# Patient Record
Sex: Male | Born: 1985
Health system: Southern US, Community
[De-identification: ages and names within clinical notes are randomized; demographics above are authoritative.]

## PROBLEM LIST (undated history)

## (undated) DIAGNOSIS — K219 Gastro-esophageal reflux disease without esophagitis: Secondary | ICD-10-CM

## (undated) DIAGNOSIS — M549 Dorsalgia, unspecified: Secondary | ICD-10-CM

---

## 2004-10-22 HISTORY — PX: CHOLECYSTECTOMY: SHX55

## 2010-09-06 ENCOUNTER — Ambulatory Visit: Payer: Self-pay | Admitting: Family Medicine

## 2010-09-06 DIAGNOSIS — J069 Acute upper respiratory infection, unspecified: Secondary | ICD-10-CM | POA: Insufficient documentation

## 2010-09-06 DIAGNOSIS — J209 Acute bronchitis, unspecified: Secondary | ICD-10-CM | POA: Insufficient documentation

## 2010-09-06 IMAGING — CR DG CHEST 2V
3 series · 3 of 3 positions shown · non-contrast
Comparison: None

CLINICAL DATA: Fever and cough

CHEST - 2 VIEW

[view not recorded (1 of 3)]
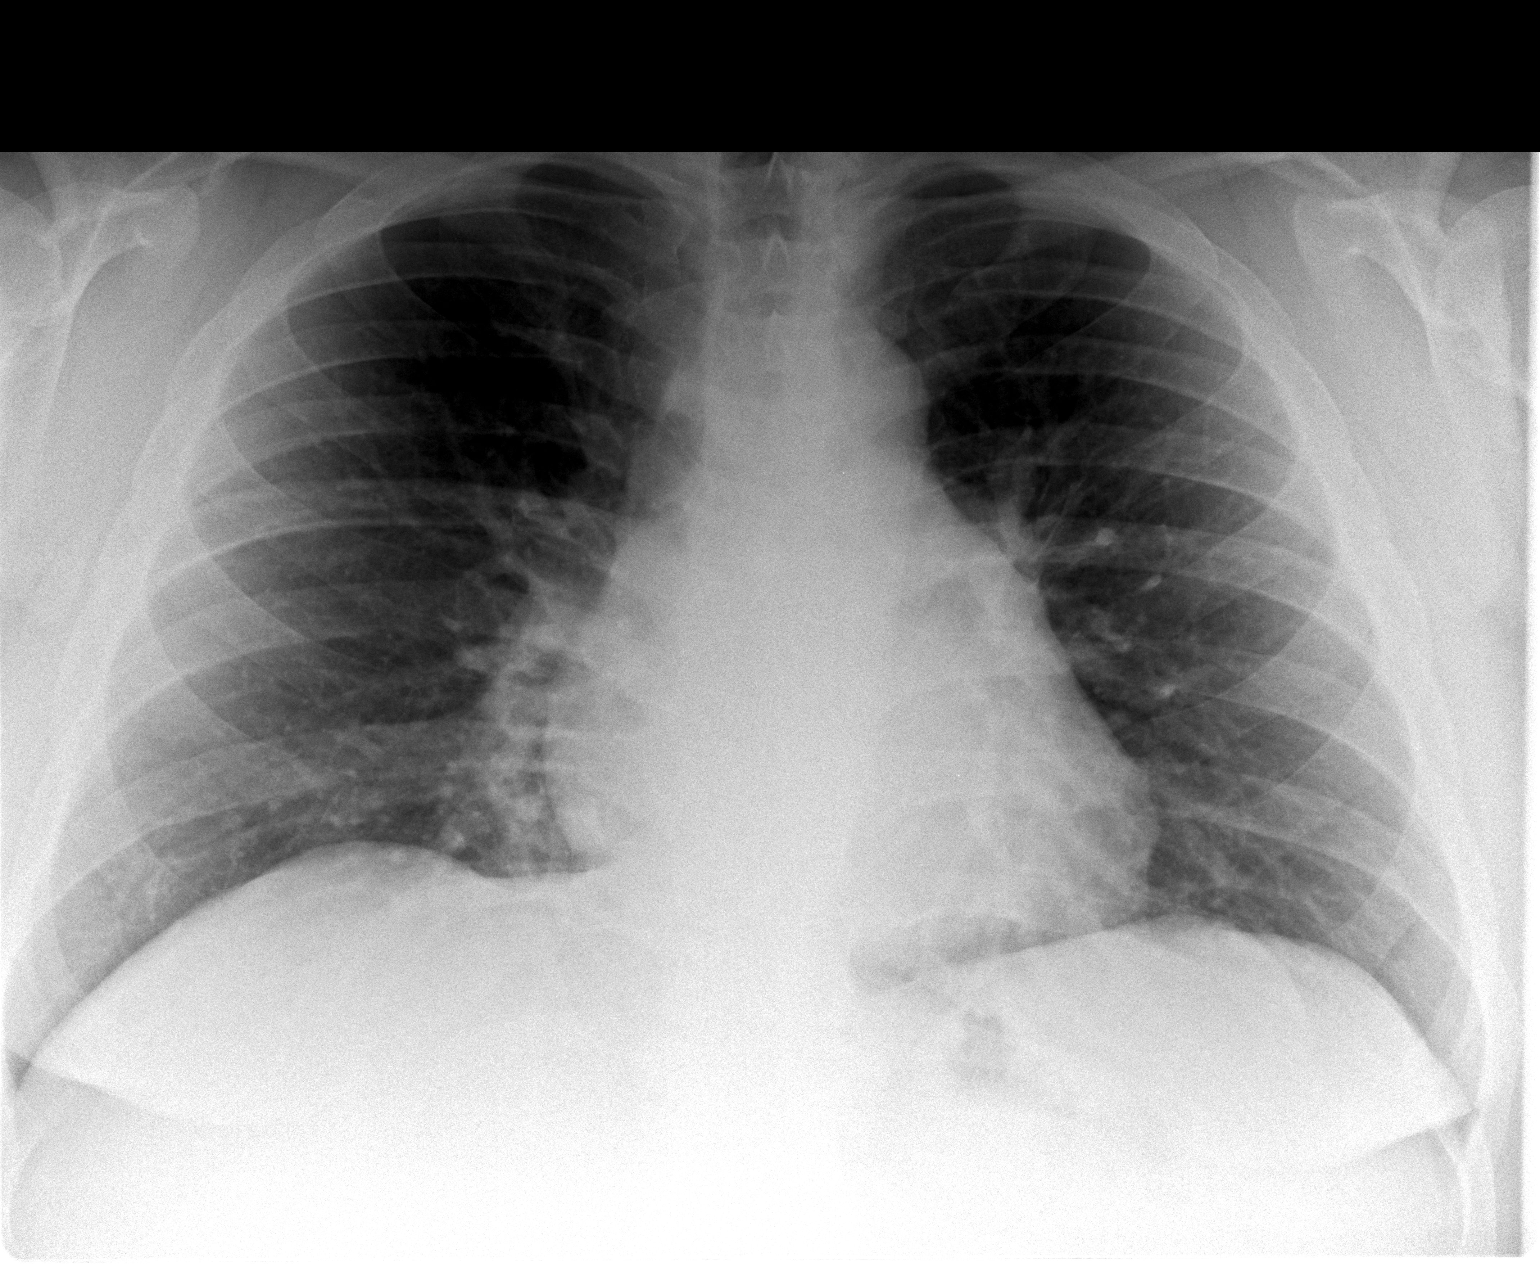

[view not recorded (2 of 3)]
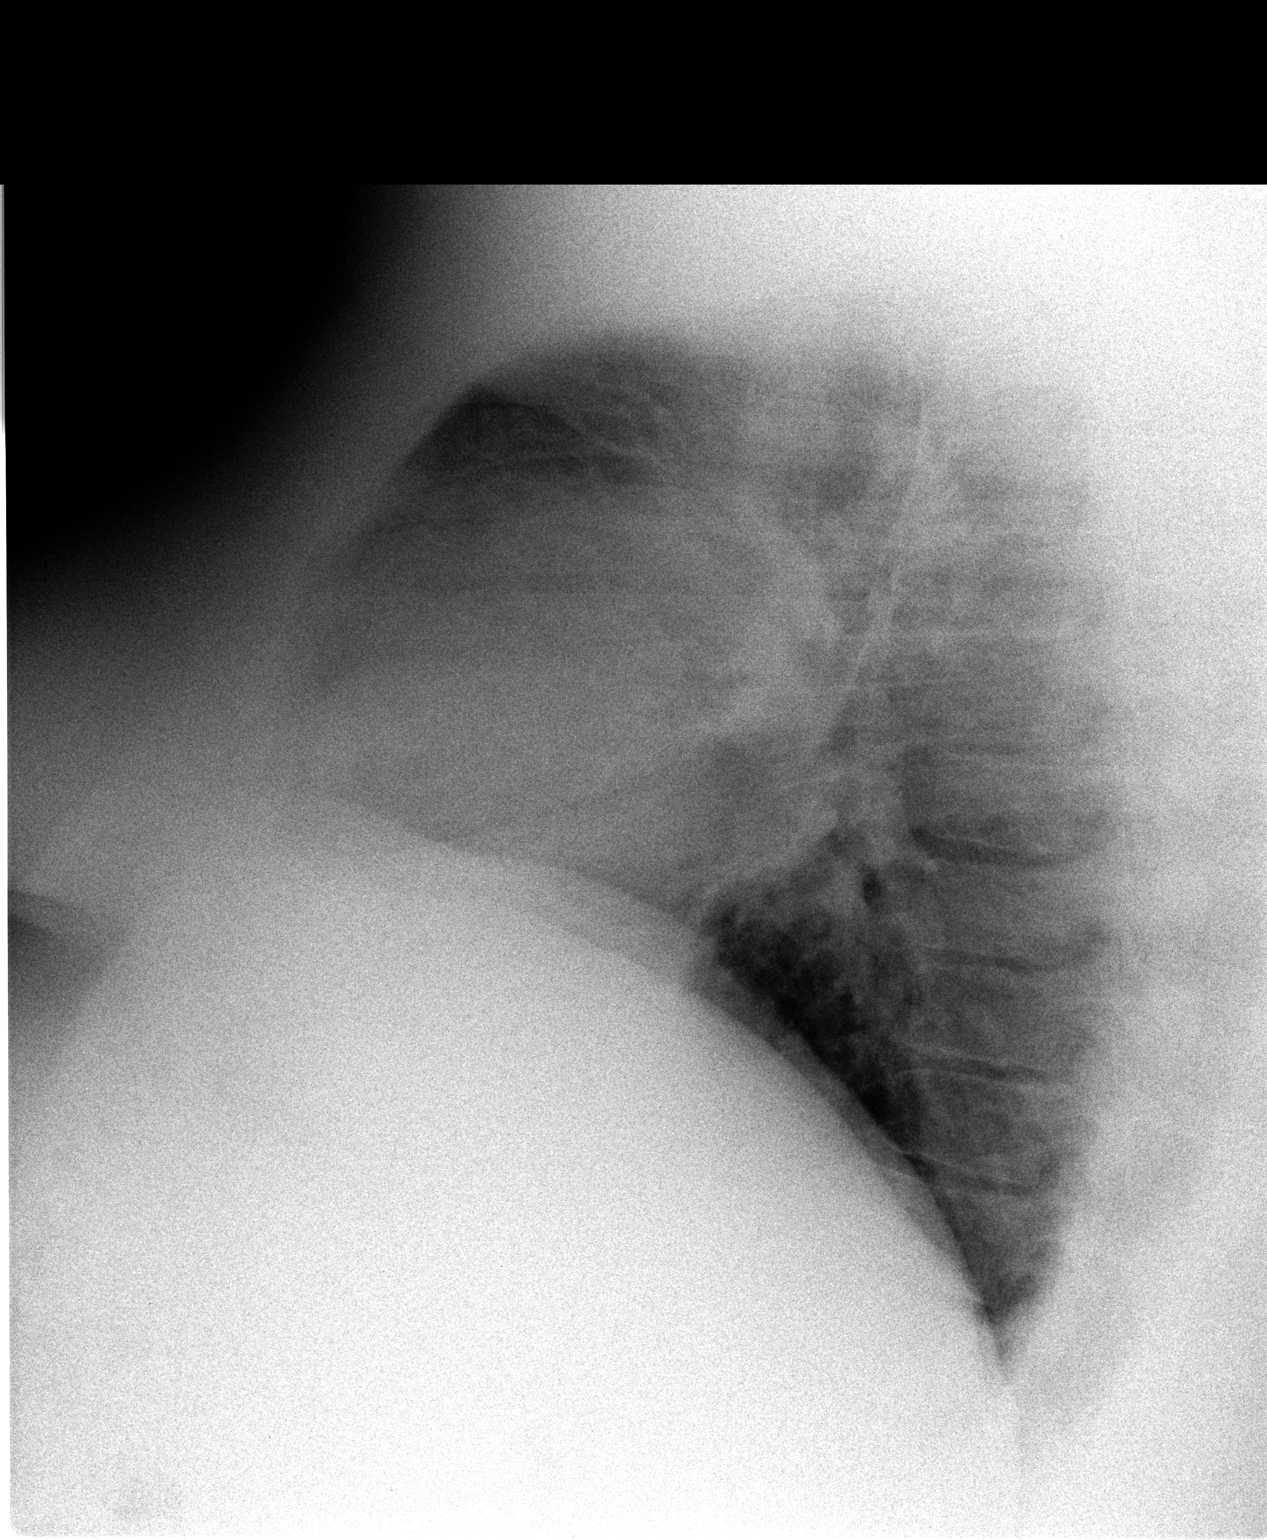

[view not recorded (3 of 3)]
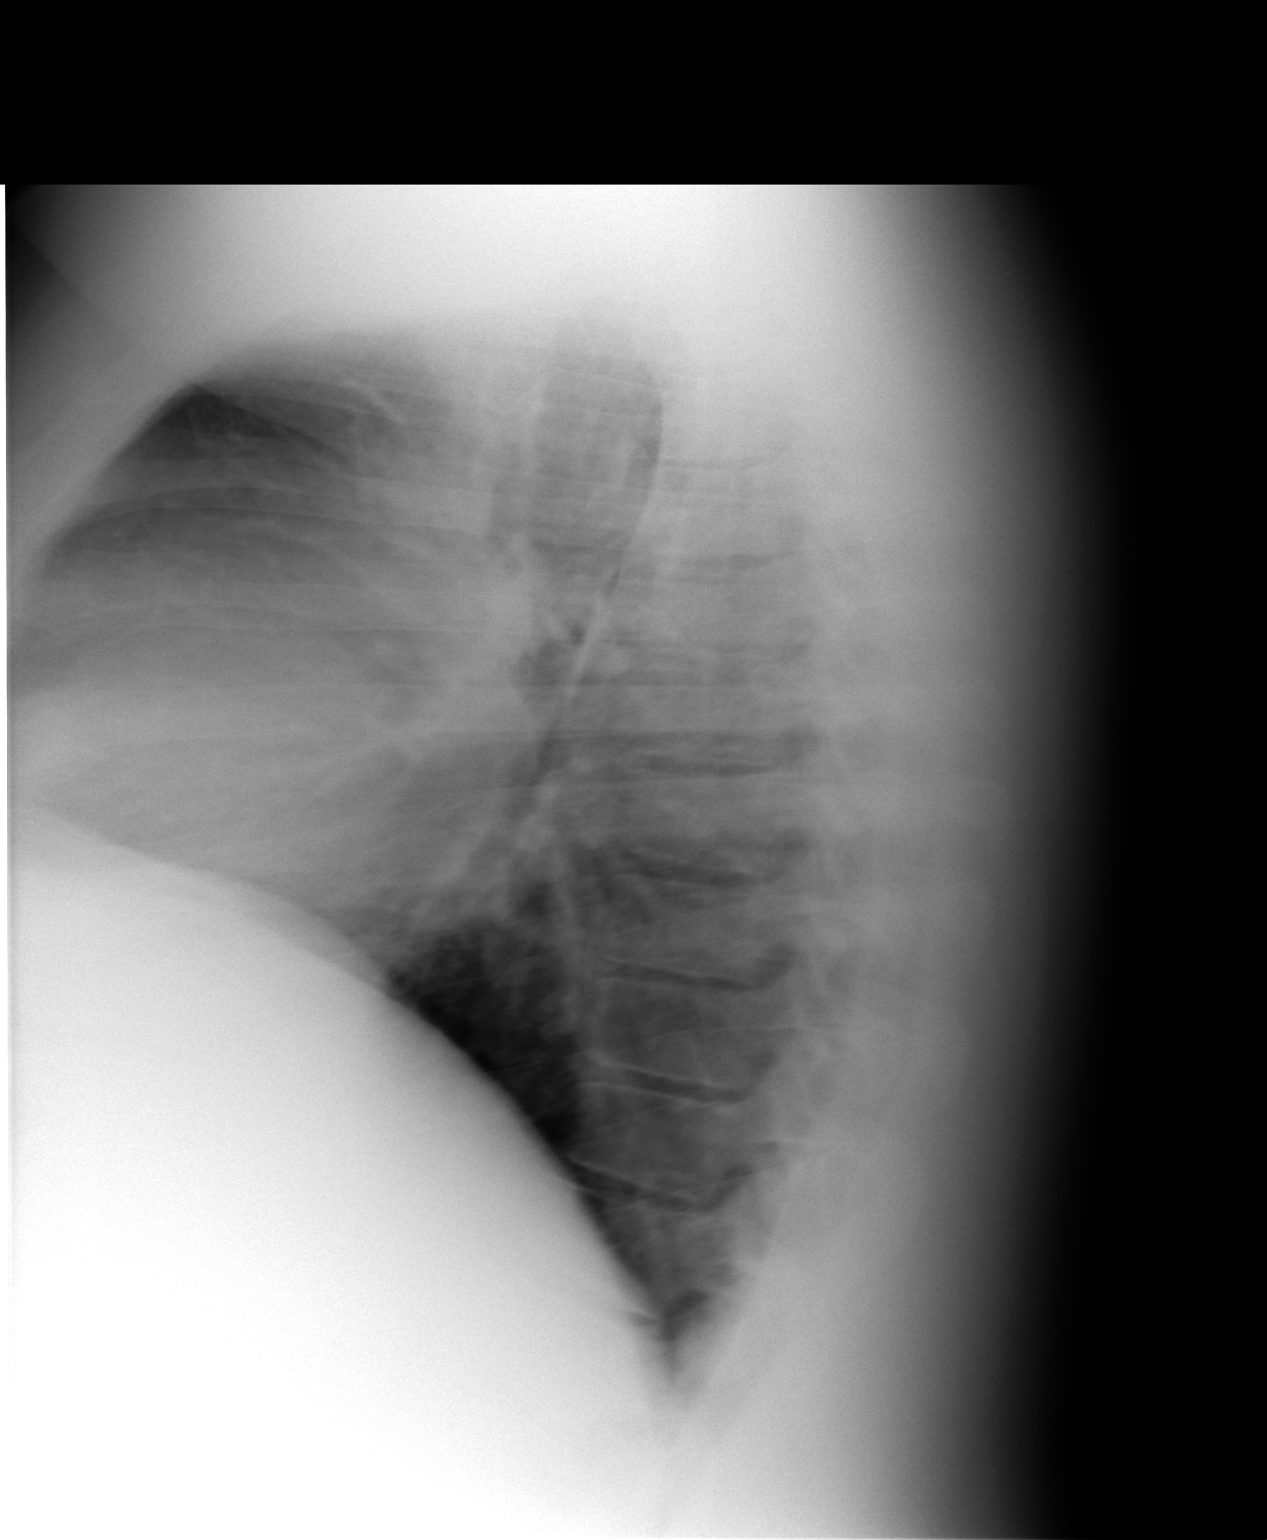

[3 of 3 positions shown; findings below may reference images not displayed]

FINDINGS: The cardiomediastinal silhouette is unremarkable.
Mild peribronchial thickening is identified.
There is no evidence of focal airspace disease, pulmonary edema,
pulmonary nodule/mass, pleural effusion, or pneumothorax.
No acute bony abnormalities are identified.
IMPRESSION: Mild peribronchial thickening without focal pneumonia.

## 2010-09-08 ENCOUNTER — Ambulatory Visit: Payer: Self-pay | Admitting: Emergency Medicine

## 2010-09-08 ENCOUNTER — Encounter: Payer: Self-pay | Admitting: Family Medicine

## 2010-11-21 NOTE — Letter (Signed)
Summary: Out of Work  [facility]  [address]   [place], [place] [address]   Phone: [phone]  Fax: [phone]    September 06, 2010   Employee:  Cloyce Lacuesta    To Whom It May Concern:   For Medical reasons, please excuse the above named employee from work today.  If you need additional information, please feel free to contact our office.         Sincerely,    Donna Christen MD

## 2010-11-21 NOTE — Assessment & Plan Note (Signed)
Summary: FEVER (rm 4)   Vital Signs:  Patient Profile:   25 Years Old Male CC:      fever, HA x today Height:     72 inches Weight:      355 pounds O2 Sat:      95 % O2 treatment:    Room Air Temp:     102.6 degrees F oral Pulse rate:   138 / minute Resp:     16 per minute BP sitting:   137 / 80  (left arm) Cuff size:   large  Pt. in pain?   yes    Location:   head    Intensity:   6    Type:       aching  Vitals Entered By: Lajean Saver RN (September 06, 2010 1:23 PM)                 `  Updated Prior Medication List: No Medications Current Allergies: ! PCN ! SULFA     History of Present Illness Chief Complaint: fever, HA x today History of Present Illness:  Subjective:  Patient suddenly developed cough, fever, chills, fatigue, headache, and myalgias today.  He has developed a vague discomfort in his right anterior chest with cough, but not pleuritic.  No sore throat or nasal congestion.  He has had pneumonia in the past.  No wheezing or shortness of breath.  No history of asthma. Not a smoker.  REVIEW OF SYSTEMS Constitutional Symptoms       Complains of fever and chills.     Denies night sweats, weight loss, weight gain, and fatigue.  Eyes       Denies change in vision, eye pain, eye discharge, glasses, contact lenses, and eye surgery. Ear/Nose/Throat/Mouth       Denies hearing loss/aids, change in hearing, ear pain, ear discharge, dizziness, frequent runny nose, frequent nose bleeds, sinus problems, sore throat, hoarseness, and tooth pain or bleeding.  Respiratory       Denies dry cough, productive cough, wheezing, shortness of breath, asthma, bronchitis, and emphysema/COPD.  Cardiovascular       Complains of chest pain.      Denies murmurs and tires easily with exhertion.      Comments: right sided with inhalation   Gastrointestinal       Complains of nausea/vomiting.      Denies stomach pain, diarrhea, constipation, blood in bowel movements, and  indigestion.      Comments: no vomiting Genitourniary       Denies painful urination, kidney stones, and loss of urinary control. Neurological       Complains of headaches.      Denies paralysis, seizures, and fainting/blackouts. Musculoskeletal       Denies muscle pain, joint pain, joint stiffness, decreased range of motion, redness, swelling, muscle weakness, and gout.  Skin       Denies bruising, unusual mles/lumps or sores, and hair/skin or nail changes.  Psych       Denies mood changes, temper/anger issues, anxiety/stress, speech problems, depression, and sleep problems. Other Comments: Patient c/o onset of fever, HA, and right sided CP when inhaling, the pain causes him to cough, x this AM.    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  Extraocular movement is intact.  Conjunctivae are not inflamed.  Ears:  Canals normal.  Tympanic membranes normal.   Nose:  No nasal congestion or sinus tenderness Pharynx:  Normal  Neck:  Supple.  No adenopathy is present.   Lungs:  Clear to auscultation.  Breath sounds are equal.  Heart:  Regular rate and rhythm without murmurs, rubs, or gallops.  Abdomen:  Nontender without masses or hepatosplenomegaly.  Bowel sounds are present.  No CVA or flank tenderness.  Extremities:  No edema.  CBC:  WBC 23.1 with 85.6 GR, 7.1 L    CHEST - 2 VIEW   Comparison: None   Findings: The cardiomediastinal silhouette is unremarkable. Mild peribronchial thickening is identified. There is no evidence of focal airspace disease, pulmonary edema, pulmonary  nodule/mass, pleural effusion, or pneumothorax. No acute bony abnormalities are identified.   IMPRESSION: Mild peribronchial thickening without focal pneumonia.   Assessment New Problems: ACUTE BRONCHITIS (ICD-466.0) RESPIRATORY DISORDER, ACUTE (ICD-465.9)   Plan New Medications/Changes: AZITHROMYCIN 500 MG TABS (AZITHROMYCIN) One by mouth once daily  #7 x 0, 09/06/2010, Donna Christen MD BENZONATATE 200 MG CAPS (BENZONATATE) One by mouth hs as needed cough  #12 x 0, 09/06/2010, Donna Christen MD CLARITHROMYCIN 500 MG TABS (CLARITHROMYCIN) One Tab by mouth two times a day  #20 x 0, 09/06/2010, Donna Christen MD  New Orders: T-Chest x-ray, 2 views [71020] CBC w/Diff [59563-87564] Rocephin  250mg  [J0696] Pulse Oximetry (single measurment) [94760] Admin of Therapeutic Inj  intramuscular or subcutaneous [96372] New Patient Level IV [33295] Planning Comments:   Rocephin 1gm  Rx written for Biaxin, but unavailable at pharmacy; will begin azithromycin 500mg  once daily for one week. Expectorant daytime, cough suppressant at bedtime.  Increase fluid intake. Return tomorrow for repeat WBC. If symptoms become significantly worse during the night,  proceed to the local emergency room.   The patient and/or caregiver has been counseled thoroughly with regard to medications prescribed including dosage, schedule, interactions, rationale for use, and possible side effects and they verbalize understanding.  Diagnoses and expected course of recovery discussed and will return if not improved as expected or if the condition worsens. Patient and/or caregiver verbalized understanding.  Prescriptions: AZITHROMYCIN 500 MG TABS (AZITHROMYCIN) One by mouth once daily  #7 x 0   Entered and Authorized by:   Donna Christen MD   Signed by:   Donna Christen MD on 09/06/2010   Method used:   Telephoned to ...         RxID:   [id number] BENZONATATE 200 MG CAPS (BENZONATATE) One by mouth hs as needed cough   #12 x 0   Entered and Authorized by:   Donna Christen MD   Signed by:   Donna Christen MD on 09/06/2010   Method used:   Print then Give to Patient   RxID:   [id number] CLARITHROMYCIN 500 MG TABS (CLARITHROMYCIN) One Tab by mouth two times a day  #20 x 0   Entered and Authorized by:   Donna Christen MD   Signed by:   Donna Christen MD on 09/06/2010   Method used:   Print then Give to Patient   RxID:   [id number]   Patient Instructions: 1)  May take Mucinex (guaifenesin) for cough. 2)  Increase fluid intake.  Rest.  Medication Administration  Injection # 1:  Medication: Rocephin  250mg     Diagnosis: ACUTE BRONCHITIS (ICD-466.0)    Route: IM    Site: LUOQ gluteus    Exp Date: 03/21/2013    Lot #: [id number]    Mfr: Novaplus    Comments: 1gm given    Patient tolerated injection without complications    Given by: Lajean Saver RN (September 06, 2010 2:57 PM)  Orders Added: 1)  T-Chest x-ray, 2 views [71020] 2)  CBC w/Diff [[id number]] 3)  Rocephin  250mg  [J0696] 4)  Pulse Oximetry (single measurment) [94760] 5)  Admin of Therapeutic Inj  intramuscular or subcutaneous [96372] 6)  New Patient Level IV [30865]

## 2011-07-24 ENCOUNTER — Encounter: Payer: Self-pay | Admitting: Family Medicine

## 2011-07-24 DIAGNOSIS — J40 Bronchitis, not specified as acute or chronic: Secondary | ICD-10-CM

## 2011-07-24 MED ORDER — AZITHROMYCIN 500 MG PO TABS: ORAL_TABLET | ORAL | Status: DC

## 2019-07-24 ENCOUNTER — Other Ambulatory Visit: Payer: Self-pay | Admitting: Occupational Medicine

## 2019-07-25 LAB — CBC WITH DIFFERENTIAL/PLATELET
Absolute Monocytes: 692 cells/uL (ref 200–950)
Basophils Absolute: 38 cells/uL (ref 0–200)
Basophils Relative: 0.5 %
Eosinophils Absolute: 91 cells/uL (ref 15–500)
Eosinophils Relative: 1.2 %
HCT: 47.2 % (ref 38.5–50.0)
Hemoglobin: 16.1 g/dL (ref 13.2–17.1)
Lymphs Abs: 2075 cells/uL (ref 850–3900)
MCH: 27.2 pg (ref 27.0–33.0)
MCHC: 34.1 g/dL (ref 32.0–36.0)
MCV: 79.9 fL — ABNORMAL LOW (ref 80.0–100.0)
MPV: 10.1 fL (ref 7.5–12.5)
Monocytes Relative: 9.1 %
Neutro Abs: 4704 cells/uL (ref 1500–7800)
Neutrophils Relative %: 61.9 %
Platelets: 285 10*3/uL (ref 140–400)
RBC: 5.91 10*6/uL — ABNORMAL HIGH (ref 4.20–5.80)
RDW: 13.7 % (ref 11.0–15.0)
Total Lymphocyte: 27.3 %
WBC: 7.6 10*3/uL (ref 3.8–10.8)

## 2019-07-25 LAB — COMPLETE METABOLIC PANEL WITH GFR
AG Ratio: 1.3 (calc) (ref 1.0–2.5)
ALT: 51 U/L — ABNORMAL HIGH (ref 9–46)
AST: 31 U/L (ref 10–40)
Albumin: 4.4 g/dL (ref 3.6–5.1)
Alkaline phosphatase (APISO): 44 U/L (ref 36–130)
BUN: 17 mg/dL (ref 7–25)
CO2: 28 mmol/L (ref 20–32)
Calcium: 10 mg/dL (ref 8.6–10.3)
Chloride: 101 mmol/L (ref 98–110)
Creat: 0.96 mg/dL (ref 0.60–1.35)
GFR, Est African American: 118 mL/min/{1.73_m2} (ref >=60)
GFR, Est Non African American: 102 mL/min/{1.73_m2} (ref >=60)
Globulin: 3.3 g/dL (calc) (ref 1.9–3.7)
Glucose, Bld: 93 mg/dL (ref 65–99)
Potassium: 4.2 mmol/L (ref 3.5–5.3)
Sodium: 138 mmol/L (ref 135–146)
Total Bilirubin: 0.6 mg/dL (ref 0.2–1.2)
Total Protein: 7.7 g/dL (ref 6.1–8.1)

## 2019-07-25 LAB — LIPID PANEL
Cholesterol: 162 mg/dL (ref <200)
HDL: 30 mg/dL — ABNORMAL LOW (ref >=40)
LDL Cholesterol (Calc): 100 mg/dL (calc) — ABNORMAL HIGH
Non-HDL Cholesterol (Calc): 132 mg/dL (calc) — ABNORMAL HIGH (ref <130)
Total CHOL/HDL Ratio: 5.4 (calc) — ABNORMAL HIGH (ref <5.0)
Triglycerides: 208 mg/dL — ABNORMAL HIGH (ref <150)

## 2019-07-25 LAB — PSA: PSA: 0.4 ng/mL (ref <=4.0)

## 2020-01-25 DIAGNOSIS — M79661 Pain in right lower leg: Secondary | ICD-10-CM | POA: Diagnosis not present

## 2020-01-25 DIAGNOSIS — M79604 Pain in right leg: Secondary | ICD-10-CM | POA: Diagnosis not present

## 2020-07-28 ENCOUNTER — Telehealth: Payer: Self-pay | Admitting: Sports Medicine

## 2020-07-28 DIAGNOSIS — R635 Abnormal weight gain: Secondary | ICD-10-CM | POA: Insufficient documentation

## 2020-07-28 MED ORDER — TADALAFIL 5 MG PO TABS: 5.0000 mg | ORAL_TABLET | Freq: Every day | ORAL | 3 refills | Status: DC | PRN

## 2020-07-28 MED ORDER — WEGOVY 1 MG/0.5ML ~~LOC~~ SOAJ: 1.0000 mg | SUBCUTANEOUS | 0 refills | Status: DC

## 2020-07-28 MED ORDER — WEGOVY 0.25 MG/0.5ML ~~LOC~~ SOAJ: 0.2500 mg | SUBCUTANEOUS | 0 refills | Status: DC

## 2020-07-28 MED ORDER — WEGOVY 0.5 MG/0.5ML ~~LOC~~ SOAJ: 0.5000 mg | SUBCUTANEOUS | 0 refills | Status: DC

## 2020-07-28 NOTE — Telephone Encounter (Signed)
Patient is requesting Wegovy and tadalafil.  These will be sent in, he was given a discount coupon for [facility].

## 2020-07-28 NOTE — Assessment & Plan Note (Signed)
Starting [facility], patient has the coupon, we will do the first 3 doses.

## 2020-08-24 ENCOUNTER — Other Ambulatory Visit: Payer: Self-pay | Admitting: Sports Medicine

## 2020-08-24 DIAGNOSIS — R635 Abnormal weight gain: Secondary | ICD-10-CM

## 2020-08-26 ENCOUNTER — Other Ambulatory Visit: Payer: Self-pay | Admitting: Sports Medicine

## 2020-08-26 ENCOUNTER — Telehealth: Payer: Self-pay | Admitting: Sports Medicine

## 2020-08-26 DIAGNOSIS — F341 Dysthymic disorder: Secondary | ICD-10-CM

## 2020-08-26 DIAGNOSIS — F32A Depression, unspecified: Secondary | ICD-10-CM | POA: Insufficient documentation

## 2020-08-26 DIAGNOSIS — R635 Abnormal weight gain: Secondary | ICD-10-CM | POA: Diagnosis not present

## 2020-08-26 DIAGNOSIS — Z23 Encounter for immunization: Secondary | ICD-10-CM | POA: Diagnosis not present

## 2020-08-26 MED ORDER — BUPROPION HCL ER (XL) 150 MG PO TB24: 150.0000 mg | ORAL_TABLET | ORAL | 11 refills | Status: DC

## 2020-08-26 MED FILL — buPROPion HCL ER (XL) 150 M: 150 | 30 days supply | Qty: 30 | Fill #0

## 2020-08-26 NOTE — Telephone Encounter (Signed)
Wellbutrin per patient request

## 2020-08-26 NOTE — Assessment & Plan Note (Signed)
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 No suicidal or homicidal ideation, he does plan to get me his PHQ-9 and GAD-7 scores for tracking purposes. I would like to revisit this in 6 weeks.

## 2020-08-27 LAB — COMPREHENSIVE METABOLIC PANEL
AG Ratio: 1.3 (calc) (ref 1.0–2.5)
ALT: 34 U/L (ref 9–46)
AST: 21 U/L (ref 10–40)
Albumin: 4.4 g/dL (ref 3.6–5.1)
Alkaline phosphatase (APISO): 49 U/L (ref 36–130)
BUN: 14 mg/dL (ref 7–25)
CO2: 28 mmol/L (ref 20–32)
Calcium: 9.8 mg/dL (ref 8.6–10.3)
Chloride: 102 mmol/L (ref 98–110)
Creat: 0.93 mg/dL (ref 0.60–1.35)
Globulin: 3.4 g/dL (calc) (ref 1.9–3.7)
Glucose, Bld: 85 mg/dL (ref 65–99)
Potassium: 4.5 mmol/L (ref 3.5–5.3)
Sodium: 139 mmol/L (ref 135–146)
Total Bilirubin: 0.6 mg/dL (ref 0.2–1.2)
Total Protein: 7.8 g/dL (ref 6.1–8.1)

## 2020-08-27 LAB — CBC
HCT: 48.5 % (ref 38.5–50.0)
Hemoglobin: 16.1 g/dL (ref 13.2–17.1)
MCH: 26.5 pg — ABNORMAL LOW (ref 27.0–33.0)
MCHC: 33.2 g/dL (ref 32.0–36.0)
MCV: 79.8 fL — ABNORMAL LOW (ref 80.0–100.0)
MPV: 9.6 fL (ref 7.5–12.5)
Platelets: 309 10*3/uL (ref 140–400)
RBC: 6.08 10*6/uL — ABNORMAL HIGH (ref 4.20–5.80)
RDW: 13.1 % (ref 11.0–15.0)
WBC: 8.1 10*3/uL (ref 3.8–10.8)

## 2020-08-27 LAB — LIPID PANEL
Cholesterol: 161 mg/dL (ref <200)
HDL: 29 mg/dL — ABNORMAL LOW (ref >=40)
LDL Cholesterol (Calc): 103 mg/dL (calc) — ABNORMAL HIGH
Non-HDL Cholesterol (Calc): 132 mg/dL (calc) — ABNORMAL HIGH (ref <130)
Total CHOL/HDL Ratio: 5.6 (calc) — ABNORMAL HIGH (ref <5.0)
Triglycerides: 175 mg/dL — ABNORMAL HIGH (ref <150)

## 2020-08-27 LAB — HEMOGLOBIN A1C
Hgb A1c MFr Bld: 5.3 % of total Hgb (ref <5.7)
Mean Plasma Glucose: 105 (calc)
eAG (mmol/L): 5.8 (calc)

## 2020-08-27 LAB — TSH: TSH: 1.95 mIU/L (ref 0.40–4.50)

## 2020-08-29 ENCOUNTER — Encounter: Payer: Self-pay | Admitting: Sports Medicine

## 2020-09-02 ENCOUNTER — Ambulatory Visit (INDEPENDENT_AMBULATORY_CARE_PROVIDER_SITE_OTHER): Payer: 59 | Admitting: Sports Medicine

## 2020-09-02 ENCOUNTER — Other Ambulatory Visit: Payer: Self-pay

## 2020-09-02 ENCOUNTER — Encounter: Payer: Self-pay | Admitting: Sports Medicine

## 2020-09-02 VITALS — BP 134/83 | HR 82 | Wt 373.0 lb

## 2020-09-02 DIAGNOSIS — Z0000 Encounter for general adult medical examination without abnormal findings: Secondary | ICD-10-CM | POA: Insufficient documentation

## 2020-09-02 DIAGNOSIS — R635 Abnormal weight gain: Secondary | ICD-10-CM | POA: Diagnosis not present

## 2020-09-02 DIAGNOSIS — L918 Other hypertrophic disorders of the skin: Secondary | ICD-10-CM

## 2020-09-02 NOTE — Assessment & Plan Note (Signed)
Cryotherapy of countless skin tags on the neck and axillae.

## 2020-09-02 NOTE — Assessment & Plan Note (Signed)
Annual physical, we froze some skin tags as well on his neck and axillae.

## 2020-09-02 NOTE — Assessment & Plan Note (Signed)
Good weight loss after the first month to 5 weeks of treatment.

## 2020-09-02 NOTE — Progress Notes (Signed)
Subjective:    CC: Annual Physical Exam  HPI:  This patient is here for their annual physical  I reviewed the past medical history, family history, social history, surgical history, and allergies today and no changes were needed.  Please see the problem list section below in epic for further details.  Past Medical History: History reviewed. No pertinent past medical history. Past Surgical History: History reviewed. No pertinent surgical history. Social History: Social History   Socioeconomic History  . Marital status: Married    Spouse name: Not on file  . Number of children: Not on file  . Years of education: Not on file  . Highest education level: Not on file  Occupational History  . Not on file  Tobacco Use  . Smoking status: Not on file  Substance and Sexual Activity  . Alcohol use: Not on file  . Drug use: Not on file  . Sexual activity: Not on file  Other Topics Concern  . Not on file  Social History Narrative  . Not on file   Social Determinants of Health   Financial Resource Strain:   . Difficulty of Paying Living Expenses: Not on file  Food Insecurity:   . Worried About Programme researcher, broadcasting/film/video in the Last Year: Not on file  . Ran Out of Food in the Last Year: Not on file  Transportation Needs:   . Lack of Transportation (Medical): Not on file  . Lack of Transportation (Non-Medical): Not on file  Physical Activity:   . Days of Exercise per Week: Not on file  . Minutes of Exercise per Session: Not on file  Stress:   . Feeling of Stress : Not on file  Social Connections:   . Frequency of Communication with Friends and Family: Not on file  . Frequency of Social Gatherings with Friends and Family: Not on file  . Attends Religious Services: Not on file  . Active Member of Clubs or Organizations: Not on file  . Attends Banker Meetings: Not on file  . Marital Status: Not on file   Family History: No family history on file. Allergies: Allergies   Allergen Reactions  . Penicillins   . Sulfonamide Derivatives    Medications: See med rec.  Review of Systems: No headache, visual changes, nausea, vomiting, diarrhea, constipation, dizziness, abdominal pain, skin rash, fevers, chills, night sweats, swollen lymph nodes, weight loss, chest pain, body aches, joint swelling, muscle aches, shortness of breath, mood changes, visual or auditory hallucinations.  Objective:    General: Well Developed, well nourished, and in no acute distress.  Neuro: Alert and oriented x3, extra-ocular muscles intact, sensation grossly intact. Cranial nerves II through XII are intact, motor, sensory, and coordinative functions are all intact. HEENT: Normocephalic, atraumatic, pupils equal round reactive to light, neck supple, no masses, no lymphadenopathy, thyroid nonpalpable. Oropharynx, nasopharynx, external ear canals are unremarkable. Skin: Warm and dry, no rashes noted.  Numerous skin tags Cardiac: Regular rate and rhythm, no murmurs rubs or gallops.  Respiratory: Clear to auscultation bilaterally. Not using accessory muscles, speaking in full sentences.  Abdominal: Soft, nontender, nondistended, positive bowel sounds, no masses, no organomegaly.  Musculoskeletal: Shoulder, elbow, wrist, hip, knee, ankle stable, and with full range of motion.  Procedure:  Cryodestruction of numerous skin tags on the neck, axillary Consent obtained and verified. Time-out conducted. Noted no overlying erythema, induration, or other signs of local infection. Completed without difficulty using Cryo-Gun. Advised to call if fevers/chills, erythema, induration, drainage,  or persistent bleeding.  Impression and Recommendations:    The patient was counselled, risk factors were discussed, anticipatory guidance given.  Annual physical exam Annual physical, we froze some skin tags as well on his neck and axillae.   Skin tag Cryotherapy of countless skin tags on the neck and  axillae.  Abnormal weight gain Good weight loss after the first month to 5 weeks of treatment.   ___________________________________________ Ihor Austin. Benjamin Stain, M.D., ABFM., CAQSM. Primary Care and Sports Medicine [place] [facility]  Adjunct Professor of Family Medicine  [employer]

## 2020-09-21 MED FILL — buPROPion HCL ER (XL) 150 M: 150 | 30 days supply | Qty: 30 | Fill #1

## 2020-09-23 ENCOUNTER — Ambulatory Visit: Payer: 59 | Admitting: Sports Medicine

## 2020-10-21 MED FILL — buPROPion HCL ER (XL) 150 M: 150 | 30 days supply | Qty: 30 | Fill #2

## 2020-12-19 MED FILL — buPROPion HCL ER (XL) 150 M: 150 | 30 days supply | Qty: 30 | Fill #4

## 2021-01-12 ENCOUNTER — Other Ambulatory Visit (HOSPITAL_BASED_OUTPATIENT_CLINIC_OR_DEPARTMENT_OTHER): Payer: Self-pay

## 2021-01-18 ENCOUNTER — Other Ambulatory Visit: Payer: Self-pay | Admitting: Sports Medicine

## 2021-01-18 MED ORDER — PREDNISONE 50 MG PO TABS: ORAL_TABLET | ORAL | 0 refills | Status: DC

## 2021-01-20 ENCOUNTER — Ambulatory Visit (INDEPENDENT_AMBULATORY_CARE_PROVIDER_SITE_OTHER): Payer: 59 | Admitting: Sports Medicine

## 2021-01-20 ENCOUNTER — Other Ambulatory Visit: Payer: Self-pay

## 2021-01-20 ENCOUNTER — Ambulatory Visit (INDEPENDENT_AMBULATORY_CARE_PROVIDER_SITE_OTHER): Payer: 59

## 2021-01-20 DIAGNOSIS — M5135 Other intervertebral disc degeneration, thoracolumbar region: Secondary | ICD-10-CM | POA: Insufficient documentation

## 2021-01-20 DIAGNOSIS — M5136 Other intervertebral disc degeneration, lumbar region: Secondary | ICD-10-CM

## 2021-01-20 DIAGNOSIS — F32A Depression, unspecified: Secondary | ICD-10-CM | POA: Diagnosis not present

## 2021-01-20 DIAGNOSIS — F419 Anxiety disorder, unspecified: Secondary | ICD-10-CM

## 2021-01-20 DIAGNOSIS — M51369 Other intervertebral disc degeneration, lumbar region without mention of lumbar back pain or lower extremity pain: Secondary | ICD-10-CM

## 2021-01-20 DIAGNOSIS — M545 Low back pain, unspecified: Secondary | ICD-10-CM | POA: Diagnosis not present

## 2021-01-20 IMAGING — DX DG LUMBAR SPINE COMPLETE 4+V
5 series · 5 of 5 positions shown · non-contrast
Comparison: None.

CLINICAL DATA: Pain

EXAM:
LUMBAR SPINE - COMPLETE 4+ VIEW

[l-spine ap]
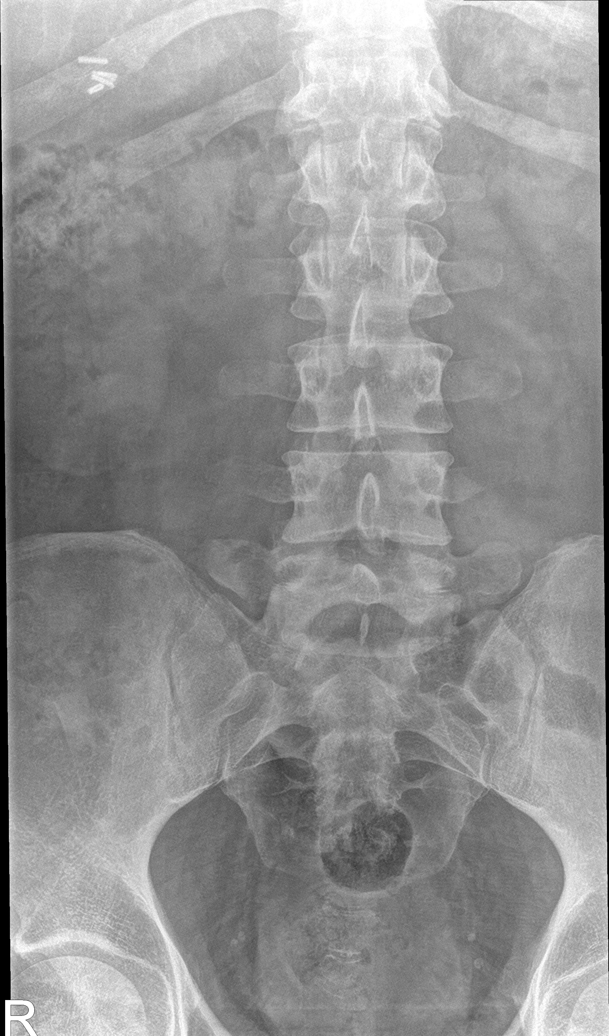

[l-spine obl (1 of 2)]
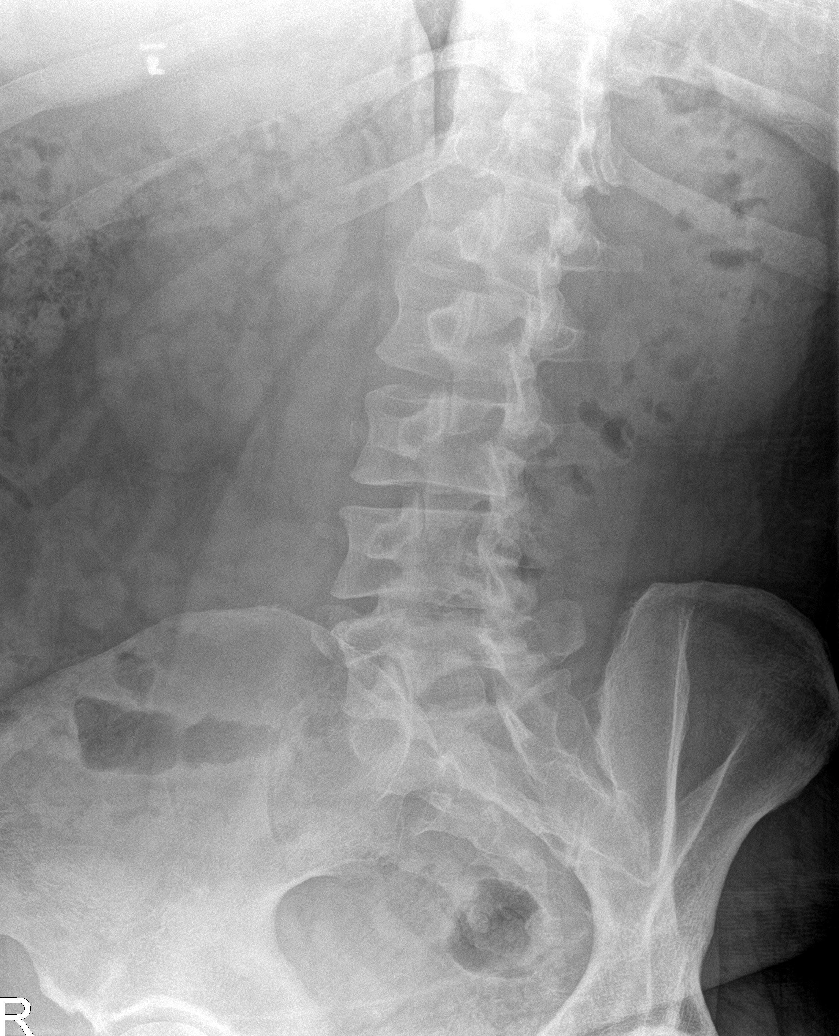

[l-spine obl (2 of 2)]
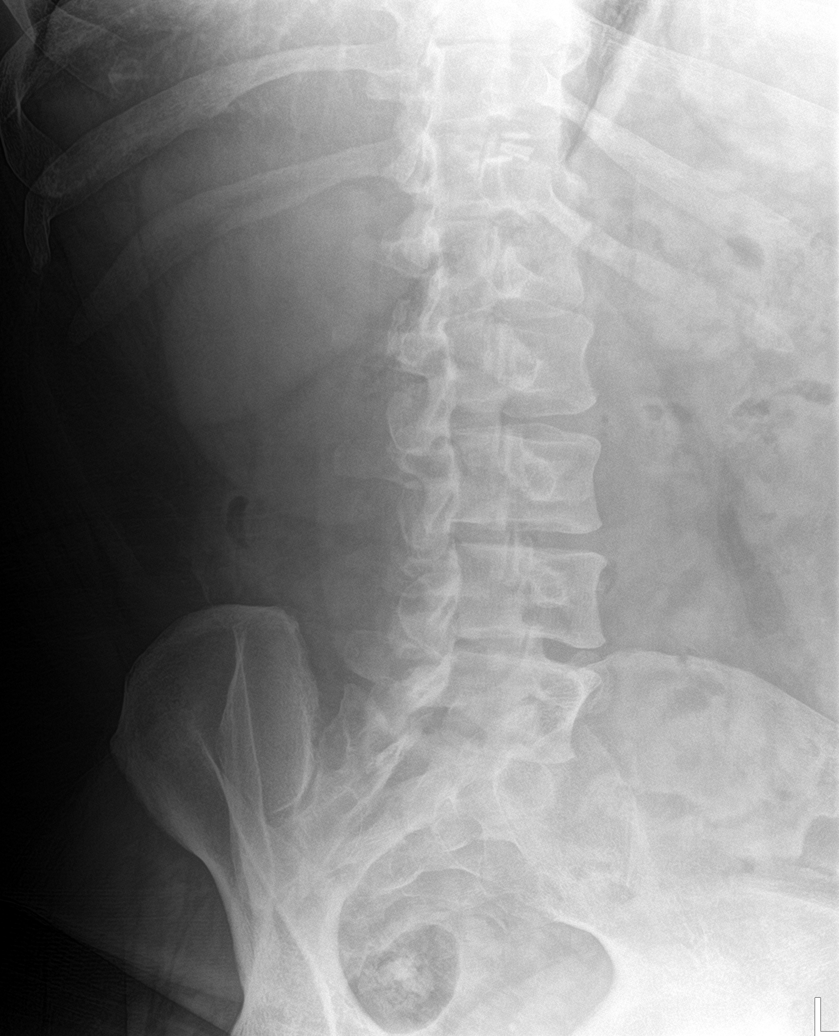

[l-spine lat]
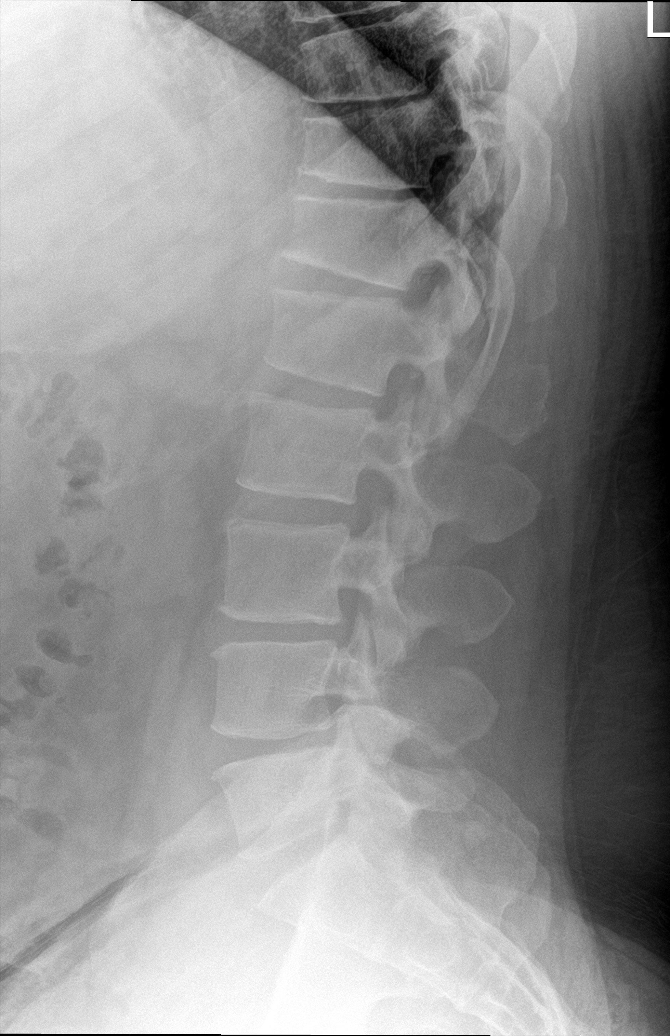

[l-spine spot]
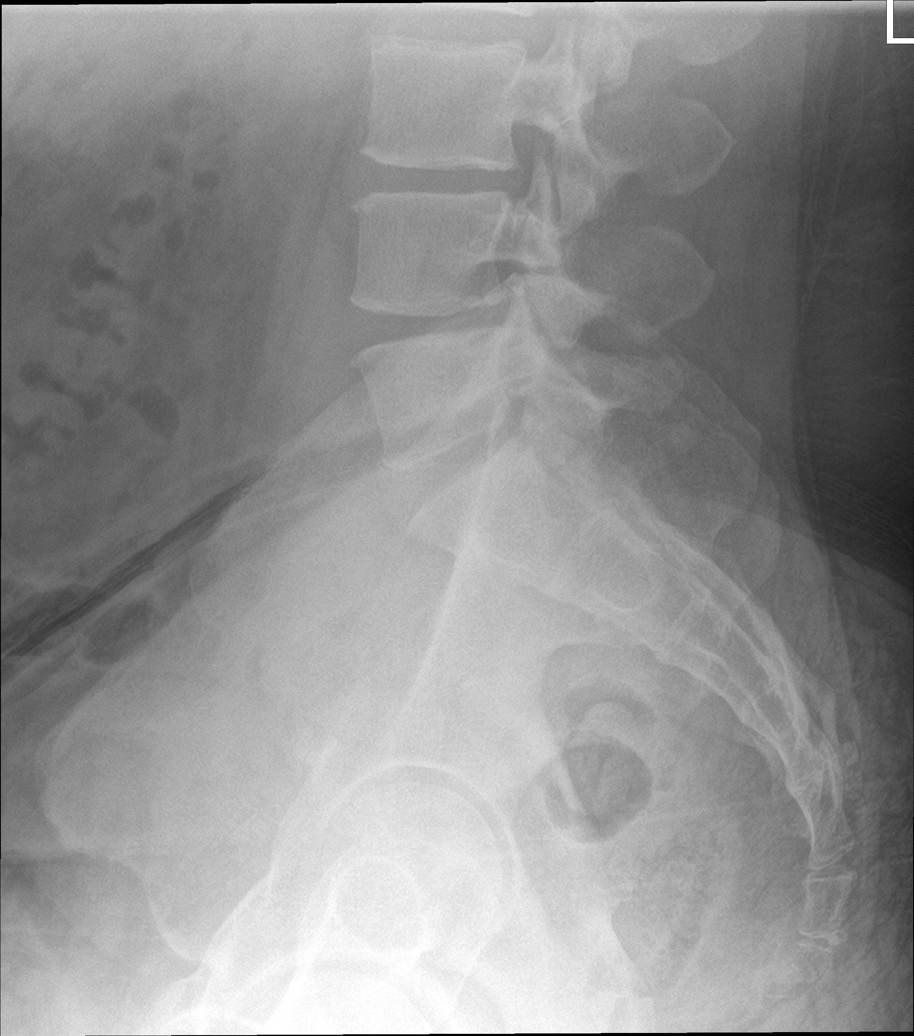

[5 of 5 positions shown; findings below may reference images not displayed]

FINDINGS: There is anterior wedging of the T12 and T11 vertebral bodies which
is age indeterminate. There is no definite acute compression
fracture of the lumbar spine. There is mild disc height loss at the
L3-L4, L4-L5, and L5-S1 levels. There is disc height loss at the
T10-T11 level. There are surgical clips in the right upper quadrant
which are presumably secondary to prior cholecystectomy. There are
small calcifications projecting over the patient's pelvis, favored
to represent phleboliths.
IMPRESSION: 1. Age-indeterminate anterior wedging of the T11 and T12 vertebral
bodies. Correlation with point tenderness is recommended.
2. No definite acute compression fracture of the lumbar spine.
3. Mild disc height loss as detailed above.

## 2021-01-20 MED ORDER — ESCITALOPRAM OXALATE 10 MG PO TABS: 10.0000 mg | ORAL_TABLET | Freq: Every day | ORAL | 3 refills | Status: DC

## 2021-01-20 MED ORDER — GABAPENTIN 300 MG PO CAPS: ORAL_CAPSULE | ORAL | 11 refills | Status: DC

## 2021-01-20 NOTE — Assessment & Plan Note (Signed)
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 Worse with sitting, flexion, Valsalva, no bowel or bladder dysfunction, saddle numbness, constitutional symptoms, no trauma. We just started prednisone, he is taking some ibuprofen with it, supplemented with omeprazole and not getting any dyspepsia. I am going to add x-rays, formal physical therapy, gabapentin to use at night. We can touch base again with regards to this in about 6 weeks and proceed with MRI for interventional planning if not better. He does understand the treatment protocol very well, and knows the steps ahead.

## 2021-01-20 NOTE — Assessment & Plan Note (Addendum)
Touch of dysthymia, seasonal affective disorder, doing much better with Wellbutrin. Adding a bit of Lexapro that he can titrate himself, switch to Trintellix if anorgasmia. Touch base with me for this again in about 6 weeks.

## 2021-01-20 NOTE — Progress Notes (Signed)
    Procedures performed today:    None.  Independent interpretation of notes and tests performed by another provider:   None.  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 Worse with sitting, flexion, Valsalva, no bowel or bladder dysfunction, saddle numbness, constitutional symptoms, no trauma. We just started prednisone, he is taking some ibuprofen with it, supplemented with omeprazole and not getting any dyspepsia. I am going to add x-rays, formal physical therapy, gabapentin to use at night. We can touch base again with regards to this in about 6 weeks and proceed with MRI for interventional planning if not better. He does understand the treatment protocol very well, and knows the steps ahead.  Anxiety and depression Touch of dysthymia, seasonal affective disorder, doing much better with Wellbutrin. Adding a bit of Lexapro that he can titrate himself, switch to Trintellix if anorgasmia. Touch base with me for this again in about 6 weeks.    ___________________________________________ Ihor Austin. Benjamin Stain, M.D., ABFM., CAQSM. Primary Care and Sports Medicine [place] [facility]  Adjunct Instructor of Family Medicine  [employer]

## 2021-01-23 MED FILL — Bupropion HCl Tab ER 24HR 150 MG: ORAL | 30 days supply | Qty: 30 | Fill #0 | Status: AC

## 2021-01-24 ENCOUNTER — Other Ambulatory Visit (HOSPITAL_COMMUNITY): Payer: Self-pay

## 2021-01-25 ENCOUNTER — Other Ambulatory Visit (HOSPITAL_COMMUNITY): Payer: Self-pay

## 2021-01-27 ENCOUNTER — Other Ambulatory Visit: Payer: Self-pay

## 2021-01-27 ENCOUNTER — Encounter: Payer: Self-pay | Admitting: Rehabilitative and Restorative Service Providers"

## 2021-01-27 ENCOUNTER — Ambulatory Visit (INDEPENDENT_AMBULATORY_CARE_PROVIDER_SITE_OTHER): Payer: 59 | Admitting: Rehabilitative and Restorative Service Providers"

## 2021-01-27 DIAGNOSIS — R29898 Other symptoms and signs involving the musculoskeletal system: Secondary | ICD-10-CM

## 2021-01-27 DIAGNOSIS — M545 Low back pain, unspecified: Secondary | ICD-10-CM | POA: Diagnosis not present

## 2021-01-27 NOTE — Patient Instructions (Signed)
Access Code: [email or web address] URL: [email or web address] Date: 01/27/2021 Prepared by: Margretta Ditty  Exercises Prone Press Up - 2 x daily - 7 x weekly - 1 sets - 10 reps - 10 seconds hold Hip Flexor Stretch at Edge of Bed - 2 x daily - 7 x weekly - 1 sets - 3 reps - 30 seconds hold Supine SI Joint Self-Correction - 2 x daily - 7 x weekly - 1 sets - 5 reps - 5 seconds hold Seated Figure 4 Piriformis Stretch - 2 x daily - 7 x weekly - 1 sets - 3 reps - 30 seconds hold

## 2021-01-27 NOTE — Therapy (Signed)
[facility] [place] [address] [place], [place], [address] Phone: [phone]   Fax:  [phone]  Physical Therapy Evaluation  Patient Details  Name: Austin GARRABRANT, Austin [name] MRN: [id number] Date of Birth: November 11, 1985 Referring Provider (PT): Rodney Langton, Austin [name]   Encounter Date: 01/27/2021   PT End of Session - 01/27/21 1632    Visit Number 1    Number of Visits 12    Date for PT Re-Evaluation 03/10/21    Authorization Type UMR    PT Start Time 1440    PT Stop Time 1522    PT Time Calculation (min) 42 min    Activity Tolerance Patient tolerated treatment well    Behavior During Therapy [facility] for tasks assessed/performed           History reviewed. No pertinent past medical history.  History reviewed. No pertinent surgical history.  There were no vitals filed for this visit.    Subjective Assessment - 01/27/21 1439    Subjective The patient notes pain x 3-4 months that has worsened over the last 4 weeks.  Standing for prolonged periods or rising after sitting for long periods provokes greater pain.  He has radiating symptoms into the R gluteal region, lateral thigh, and into proximal lower leg.    Patient Stated Goals reduce pain    Currently in Pain? Yes    Pain Score 4     Pain Location Back    Pain Orientation Right    Pain Descriptors / Indicators Radiating;Sharp;Throbbing   ice pick sensation in R gluteal region   Pain Type Acute pain    Pain Radiating Towards R LE    Pain Onset More than a month ago    Pain Frequency Intermittent    Aggravating Factors  flexion , prolonged sitting or standing    Pain Relieving Factors extension              [facility] PT Assessment - 01/27/21 1446      Assessment   Medical Diagnosis lumbar degenerative disc disase with R leg radiating symptoms    Referring Provider (PT) Rodney Langton, Austin [name]    Onset Date/Surgical Date 01/20/21    Hand Dominance Left      Precautions    Precautions None      Restrictions   Weight Bearing Restrictions No      Balance Screen   Has the patient fallen in the past 6 months No    Has the patient had a decrease in activity level because of a fear of falling?  No    Is the patient reluctant to leave their home because of a fear of falling?  No      Home Nurse, mental health Private residence    Living Arrangements Children;Spouse/significant other      Prior Function   Level of Independence Independent    Vocation Full time employment      Observation/Other Assessments   Focus on Therapeutic Outcomes (FOTO)  61% functional status score      Sensation   Light Touch Appears Intact      ROM / Strength   AROM / PROM / Strength AROM;Strength      AROM   Overall AROM  Deficits    Overall AROM Comments --    AROM Assessment Site Lumbar    Lumbar Flexion 50% limitation    Lumbar Extension 25% limitation    Lumbar - Right Side Bend 25% limitation  Lumbar - Left Side Bend 25% limitation      Strength   Strength Assessment Site Hip;Knee;Ankle    Right/Left Hip Right;Left    Right Hip Flexion 5/5    Right Hip ABduction 5/5   reproduces pain in R SI region   Left Hip Flexion 5/5    Left Hip ABduction 5/5    Right/Left Knee Right;Left    Right Knee Flexion 5/5    Right Knee Extension 5/5    Left Knee Flexion 5/5    Left Knee Extension 5/5    Right/Left Ankle Right;Left    Right Ankle Dorsiflexion 5/5    Left Ankle Dorsiflexion 5/5      Flexibility   Soft Tissue Assessment /Muscle Length yes    Hamstrings equal bilaterally; mild tightness noted    Quadriceps WNLs      Palpation   Spinal mobility no tenderness with CPA lumbar spine    SI assessment  tender to palpation of R SI region superiorly; relief of pain with R thomas test stretch    Palpation comment tender to palpation R SI region, no tenderness in lumbar spine      Special Tests    Special Tests Lumbar    Lumbar Tests Straight Leg  Raise;FABER test      FABER test   findings Positive    Side Right    Comment discomfort in R SI region      Straight Leg Raise   Findings Negative    Side  Right                      Objective measurements completed on examination: See above findings.       [facility] Adult PT Treatment/Exercise - 01/27/21 1651      Exercises   Exercises Lumbar      Lumbar Exercises: Stretches   Hip Flexor Stretch Right;1 rep;30 seconds    Press Ups 10 reps;10 seconds    Piriformis Stretch Right;Left;1 rep;30 seconds    Piriformis Stretch Limitations painful on R-- recommended stretch to gentle pull    Other Lumbar Stretch Exercise standing R lunge on step with rotation to the R side      Lumbar Exercises: Supine   Isometric Hip Flexion 5 reps;5 seconds                  PT Education - 01/27/21 1630    Education Details HEP    Person(s) Educated Patient    Methods Explanation;Demonstration;Handout    Comprehension Verbalized understanding;Returned demonstration               PT Long Term Goals - 01/27/21 1637      PT LONG TERM GOAL #1   Title The patient will be indep with HEP.    Time 6    Period Weeks    Target Date 03/10/21      PT LONG TERM GOAL #2   Title The patient will improve functional status score from 61% to > or equal to 73%.    Time 6    Period Weeks    Target Date 03/10/21      PT LONG TERM GOAL #3   Title The patient will report centralization of symptoms from R lateral leg to low back/SI region.    Time 6    Period Weeks    Target Date 03/10/21      PT LONG TERM GOAL #4   Title The patient  will be able to stand at work with 50% reduction in pain.    Time 6    Period Weeks    Target Date 03/10/21                  Plan - 01/27/21 1656    Clinical Impression Statement The patient is a 35 year old male presenting to OP rehab with worsening low back pain with radiating symptoms into the R LE.  He has impairments in lumbar  ROM, point tenderness R SI region, mild tightness in hamstrings and pain reproduced with flexion and resisted hip abduction during exam.  PT to address deficits in order to reduce pain for work and recreational tasks.    Examination-Activity Limitations Sit;Stand    Examination-Participation Restrictions Occupation    Stability/Clinical Decision Making Stable/Uncomplicated    Clinical Decision Making Low    Rehab Potential Good    PT Frequency 2x / week    PT Duration 6 weeks    PT Treatment/Interventions Taping;Patient/family education;ADLs/Self Care Home Management;Therapeutic activities;Therapeutic exercise;Manual techniques;Electrical Stimulation;Moist Heat;Traction;Dry needling    PT Next Visit Plan Assess response to initial extension exercises-- any centralization?  Progress ther ex adding core stabilization, hip abductor strengthening, SI muscle energy techniques, deep hip stretches.  Consider traction for radiating symptoms and DN for glut musculature if not responding to initial ther ex.    PT Home Exercise Plan Access Code: [email or web address]    Consulted and Agree with Plan of Care Patient           Patient will benefit from skilled therapeutic intervention in order to improve the following deficits and impairments:  Pain,Impaired flexibility,Decreased range of motion,Increased fascial restricitons  Visit Diagnosis: Acute right-sided low back pain without sciatica  Other symptoms and signs involving the musculoskeletal system     Problem List Patient Active Problem List   Diagnosis Date Noted  . Lumbar degenerative disc disease 01/20/2021  . Annual physical exam 09/02/2020  . Skin tag 09/02/2020  . Anxiety and depression 08/26/2020  . Abnormal weight gain 07/28/2020  . RESPIRATORY DISORDER, ACUTE 09/06/2010  . ACUTE BRONCHITIS 09/06/2010    Mayana Irigoyen 01/27/2021, 5:05 PM  [facility] [address]  [address] [place], [place], [address] Phone: [phone]   Fax:  [phone]  Name: Austin BEBO, Austin [name] MRN: [id number] Date of Birth: [birth year]

## 2021-02-06 ENCOUNTER — Encounter: Payer: Self-pay | Admitting: Rehabilitative and Restorative Service Providers"

## 2021-02-06 ENCOUNTER — Ambulatory Visit (INDEPENDENT_AMBULATORY_CARE_PROVIDER_SITE_OTHER): Payer: 59 | Admitting: Rehabilitative and Restorative Service Providers"

## 2021-02-06 ENCOUNTER — Other Ambulatory Visit: Payer: Self-pay

## 2021-02-06 DIAGNOSIS — R29898 Other symptoms and signs involving the musculoskeletal system: Secondary | ICD-10-CM | POA: Diagnosis not present

## 2021-02-06 DIAGNOSIS — M545 Low back pain, unspecified: Secondary | ICD-10-CM

## 2021-02-06 NOTE — Therapy (Signed)
[facility] [place] [address] [place], [place], [address] Phone: [phone]   Fax:  [phone]  Physical Therapy Treatment  Patient Details  Name: Austin GALES, MD MRN: [id number] Date of Birth: 16-Jul-1986 Referring Provider (PT): Rodney Langton, MD   Encounter Date: 02/06/2021   PT End of Session - 02/06/21 1344    Visit Number 2    Number of Visits 12    Date for PT Re-Evaluation 03/10/21    Authorization Type UMR    PT Start Time 1345    PT Stop Time 1425    PT Time Calculation (min) 40 min    Activity Tolerance Patient tolerated treatment well    Behavior During Therapy [facility] for tasks assessed/performed           History reviewed. No pertinent past medical history.  History reviewed. No pertinent surgical history.  There were no vitals filed for this visit.   Subjective Assessment - 02/06/21 1344    Subjective The patient notes pain worse with flexion during setting up RV on vacation last week.  He is tolerating HEP noting some discomfort in L SI region with press ups.  Hip flexor stretch decreases pain R side.    Patient Stated Goals reduce pain    Currently in Pain? Yes    Pain Score 3     Pain Location Back    Pain Orientation Right    Pain Descriptors / Indicators Aching    Pain Type Acute pain    Pain Onset More than a month ago    Pain Frequency Intermittent    Aggravating Factors  flexion, standing in one position, sitting for  longer periods    Pain Relieving Factors hip flexor stetch              [facility] PT Assessment - 02/06/21 1357      Assessment   Medical Diagnosis lumbar degenerative disc disase with R leg radiating symptoms    Referring Provider (PT) Rodney Langton, MD    Onset Date/Surgical Date 01/20/21                         [facility] Adult PT Treatment/Exercise - 02/06/21 1357      Exercises   Exercises Lumbar      Lumbar Exercises: Stretches   Press  Ups 5 reps;10 seconds    Press Ups Limitations activated core and reduced ROM to avoid L SI pain    Quadruped Mid Back Stretch 1 rep;30 seconds    Figure 4 Stretch 1 rep;30 seconds    Other Lumbar Stretch Exercise lumbar rocking and deep spinal twist (patient demos a spinal twist with leg extended that helps reduce pain at home)      Lumbar Exercises: Aerobic   Tread Mill x 3 minutes at 2.4 mph for warm up      Lumbar Exercises: Standing   Wall Slides 10 reps    Other Standing Lumbar Exercises chair squat x 12 reps; feels awareness of pain R SI region`    Other Standing Lumbar Exercises foot in a chair leaning towards the floor to open through SI region      Lumbar Exercises: Supine   Dead Bug --   fatigues with 1 rep   Bridge 3 seconds   8 reps   Bridge Limitations with towel roll to increase extension ROM    Single Leg Bridge --   3 reps R and  L/ no pain on L   Bridge with [employer] Limitations pain on R    Other Supine Lumbar Exercises chair position with altenrating foot taps      Lumbar Exercises: Quadruped   Madcat/Old Horse 10 reps    Madcat/Old Horse Limitations cues for technique and to do 60% of motion due to pain with end range flexion/rounding    Other Quadruped Lumbar Exercises heel sitting into child's pose                  PT Education - 02/06/21 1429    Education Details HEP    Person(s) Educated Patient    Methods Explanation;Demonstration;Handout    Comprehension Verbalized understanding;Returned demonstration               PT Long Term Goals - 01/27/21 1637      PT LONG TERM GOAL #1   Title The patient will be indep with HEP.    Time 6    Period Weeks    Target Date 03/10/21      PT LONG TERM GOAL #2   Title The patient will improve functional status score from 61% to > or equal to 73%.    Time 6    Period Weeks    Target Date 03/10/21      PT LONG TERM GOAL #3   Title The patient will report centralization of symptoms from R  lateral leg to low back/SI region.    Time 6    Period Weeks    Target Date 03/10/21      PT LONG TERM GOAL #4   Title The patient will be able to stand at work with 50% reduction in pain.    Time 6    Period Weeks    Target Date 03/10/21                 Plan - 02/06/21 1637    Clinical Impression Statement PT continued to progress exercises to improve lumbar mobility and also for SI self correction.  Plan to continue to progress ther ex to patient tolerance focusing on hip flexor stretching, SI self correction, lumbar mobilization, and core stability.    PT Treatment/Interventions Taping;Patient/family education;ADLs/Self Care Home Management;Therapeutic activities;Therapeutic exercise;Manual techniques;Electrical Stimulation;Moist Heat;Traction;Dry needling    PT Next Visit Plan Check  HEP additions from last visit,  hip abductor strengthening, SI muscle energy techniques, deep hip stretches.  Consider DN for glut musculature and lumbar multifidi if not responding to initial ther ex.    PT Home Exercise Plan Access Code: [email or web address]    Consulted and Agree with Plan of Care Patient           Patient will benefit from skilled therapeutic intervention in order to improve the following deficits and impairments:     Visit Diagnosis: Acute right-sided low back pain without sciatica  Other symptoms and signs involving the musculoskeletal system     Problem List Patient Active Problem List   Diagnosis Date Noted  . Lumbar degenerative disc disease 01/20/2021  . Annual physical exam 09/02/2020  . Skin tag 09/02/2020  . Anxiety and depression 08/26/2020  . Abnormal weight gain 07/28/2020  . RESPIRATORY DISORDER, ACUTE 09/06/2010  . ACUTE BRONCHITIS 09/06/2010    Nyzir Dubois , PT 02/06/2021, 4:41 PM  [facility] [address] [place], [place], [address] Phone: [phone]   Fax:  [phone]  Name: RHYDER KOEGEL, MD MRN: [id number] Date of  Birth: [birth year]

## 2021-02-06 NOTE — Patient Instructions (Signed)
Access Code: [email or web address] URL: [email or web address] Date: 02/06/2021 Prepared by: Margretta Ditty  Program Notes *   Exercises Prone Press Up - 2 x daily - 7 x weekly - 1 sets - 10 reps - 10 seconds hold Cat-Camel to Child's Pose - 2 x daily - 7 x weekly - 1 sets - 10 reps Hip Flexor Stretch at Edge of Bed - 2 x daily - 7 x weekly - 1 sets - 3 reps - 30 seconds hold Supine SI Joint Self-Correction - 2 x daily - 7 x weekly - 1 sets - 5 reps - 5 seconds hold Squat with Chair Touch - 2 x daily - 7 x weekly - 1 sets - 10 reps Seated Figure 4 Piriformis Stretch - 2 x daily - 7 x weekly - 1 sets - 3 reps - 30 seconds hold Single Leg Stance - 2 x daily - 7 x weekly - 1 sets - 5 reps - 10-15 seconds hold Standing Back Flexion Stretch with Foot on Chair - 2 x daily - 7 x weekly - 1 sets - 3 reps - 30 seconds hold

## 2021-02-10 ENCOUNTER — Other Ambulatory Visit: Payer: Self-pay

## 2021-02-10 ENCOUNTER — Ambulatory Visit (INDEPENDENT_AMBULATORY_CARE_PROVIDER_SITE_OTHER): Payer: 59 | Admitting: Physical Therapy

## 2021-02-10 DIAGNOSIS — R29898 Other symptoms and signs involving the musculoskeletal system: Secondary | ICD-10-CM | POA: Diagnosis not present

## 2021-02-10 DIAGNOSIS — M545 Low back pain, unspecified: Secondary | ICD-10-CM

## 2021-02-10 NOTE — Therapy (Signed)
[place], [place], [address] Phone: [phone]   Fax:  [phone]  Physical Therapy Treatment  Patient Details  Name: Austin CLIMER, Austin [name] MRN: [id number] Date of Birth: 1986/08/14 Referring Provider (PT): Aundria Mems, Austin [name]   Encounter Date: 02/10/2021   PT End of Session - 02/10/21 1400    Visit Number 3    Number of Visits 12    Date for PT Re-Evaluation 03/10/21    PT Start Time 9357    PT Stop Time 1358    PT Time Calculation (min) 43 min    Activity Tolerance Patient tolerated treatment well    Behavior During Therapy [facility] for tasks assessed/performed           No past medical history on file.  No past surgical history on file.  There were no vitals filed for this visit.   Subjective Assessment - 02/10/21 1316    Subjective Pt continues to c/o pain with flexion. States pain level "depends on the day"    Patient Stated Goals reduce pain    Currently in Pain? No/denies                             [facility] Adult PT Treatment/Exercise - 02/10/21 0001      Lumbar Exercises: Stretches   Quadruped Mid Back Stretch 5 reps;10 seconds    Other Lumbar Stretch Exercise childs pose with Lt lateral flexion 5 x 10 sec, childs pose x 1 minute after manual      Lumbar Exercises: Aerobic   Tread Mill 3 mins level 2.5 for warm up      Lumbar Exercises: Standing   Wall Slides 10 reps    Other Standing Lumbar Exercises standing on Rt LE with 3 way vectors with Lt LE x 10      Lumbar Exercises: Seated   Sit to Stand 10 reps    Sit to Stand Limitations with ball squeeze    Other Seated Lumbar Exercises hip adductor isometric x 10      Lumbar Exercises: Supine   Bridge 10 reps;3 seconds    Basic Lumbar Stabilization 10 reps;3 seconds    Basic Lumbar Stabilization Limitations in chair position with UEs pressing on knees for isometric      Lumbar Exercises: Sidelying    Clam 20 reps;Right    Clam Limitations reverse clam x 10 Rt    Hip Abduction 20 reps;Right      Lumbar Exercises: Quadruped   Madcat/Old Horse 10 reps      Manual Therapy   Manual Therapy Joint mobilization;Soft tissue mobilization    Joint Mobilization SI grade 2-3 CPAs and UPAs to improve jt mobility    Soft tissue mobilization STM Rt glutes and lumbar paraspinals                       PT Long Term Goals - 01/27/21 1637      PT LONG TERM GOAL #1   Title The patient will be indep with HEP.    Time 6    Period Weeks    Target Date 03/10/21      PT LONG TERM GOAL #2   Title The patient will improve functional status score from 61% to > or equal to 73%.    Time 6    Period Weeks    Target Date 03/10/21  PT LONG TERM GOAL #3   Title The patient will report centralization of symptoms from R lateral leg to low back/SI region.    Time 6    Period Weeks    Target Date 03/10/21      PT LONG TERM GOAL #4   Title The patient will be able to stand at work with 50% reduction in pain.    Time 6    Period Weeks    Target Date 03/10/21                 Plan - 02/10/21 1400    Clinical Impression Statement Pt feels good relief with Lt sidebending in childs pose and with aggressive SIJ mobs. Pt continues with decreased core mm endurance and requires frequent rest breaks during exercises. good tolerance to addition of sidelying hip abduction strength today    PT Next Visit Plan SI MET, manual as tolerated, DN if needed, core strength and endurance    PT Home Exercise Plan Access Code: [email or web address]    Consulted and Agree with Plan of Care Patient           Patient will benefit from skilled therapeutic intervention in order to improve the following deficits and impairments:     Visit Diagnosis: Acute right-sided low back pain without sciatica  Other symptoms and signs involving the musculoskeletal system     Problem List Patient Active Problem List    Diagnosis Date Noted  . Lumbar degenerative disc disease 01/20/2021  . Annual physical exam 09/02/2020  . Skin tag 09/02/2020  . Anxiety and depression 08/26/2020  . Abnormal weight gain 07/28/2020  . RESPIRATORY DISORDER, ACUTE 09/06/2010  . ACUTE BRONCHITIS 09/06/2010   Joline Encalada, PT  Britne Borelli 02/10/2021, 2:02 PM  [facility] [place], [place], [address] Phone: [phone]   Fax:  [phone]  Name: Austin FRANCA, Austin [name] MRN: [id number] Date of Birth: 1985-12-24

## 2021-02-13 ENCOUNTER — Encounter: Payer: Self-pay | Admitting: Rehabilitative and Restorative Service Providers"

## 2021-02-13 ENCOUNTER — Other Ambulatory Visit: Payer: Self-pay

## 2021-02-13 ENCOUNTER — Ambulatory Visit (INDEPENDENT_AMBULATORY_CARE_PROVIDER_SITE_OTHER): Payer: 59 | Admitting: Rehabilitative and Restorative Service Providers"

## 2021-02-13 DIAGNOSIS — R29898 Other symptoms and signs involving the musculoskeletal system: Secondary | ICD-10-CM | POA: Diagnosis not present

## 2021-02-13 DIAGNOSIS — M545 Low back pain, unspecified: Secondary | ICD-10-CM

## 2021-02-13 NOTE — Therapy (Signed)
[facility] [place] [address] [place] [address] [place], [place], [address] Phone: [phone]   Fax:  [phone]  Physical Therapy Treatment  Patient Details  Name: Austin FEINSTEIN, MD MRN: [id number] Date of Birth: September 21, 1986 Referring Provider (PT): Rodney Langton, MD   Encounter Date: 02/13/2021   PT End of Session - 02/13/21 2114    Visit Number 4    Number of Visits 12    Date for PT Re-Evaluation 03/10/21    Authorization Type UMR    PT Start Time 1445    PT Stop Time 1520    PT Time Calculation (min) 35 min    Activity Tolerance Patient tolerated treatment well    Behavior During Therapy [facility] for tasks assessed/performed           History reviewed. No pertinent past medical history.  History reviewed. No pertinent surgical history.  There were no vitals filed for this visit.   Subjective Assessment - 02/13/21 1446    Subjective The patient reports pain is less since SI mobilizations last week.  He has 1-2/10 pain with ibuprofen.    Patient Stated Goals reduce pain    Currently in Pain? Yes    Pain Score 2     Pain Location Back    Pain Orientation Right    Pain Descriptors / Indicators Aching    Pain Type Acute pain    Pain Onset More than a month ago    Pain Frequency Intermittent    Aggravating Factors  flexion    Pain Relieving Factors mobs improve pain              [facility] PT Assessment - 02/13/21 1455      Assessment   Medical Diagnosis lumbar degenerative disc disase with R leg radiating symptoms    Referring Provider (PT) Rodney Langton, MD    Onset Date/Surgical Date 01/20/21                         [facility] Adult PT Treatment/Exercise - 02/13/21 1455      Exercises   Exercises Lumbar;Knee/Hip      Lumbar Exercises: Stretches   Press Ups 5 reps;10 seconds    Press Ups Limitations with mobilization with movement into R SI PA mob    Other Lumbar Stretch Exercise supine extension  over towel roll adding lumbar rocking/rotation    Other Lumbar Stretch Exercise SIJ mobilization with L LE off edge of mat / R leg on mat with prone press up      Lumbar Exercises: Aerobic   Tread Mill 3 minutes x 2.5 mph      Knee/Hip Exercises: Standing   SLS with Vectors clock reach 12, 3, 6    Other Standing Knee Exercises self mobilization R foot on 8" step with anterior translation      Manual Therapy   Manual Therapy Joint mobilization;Soft tissue mobilization    Joint Mobilization SI grade 3 CPAs and UPAs to improve joint mobility; Lumbar CPA mobs    Soft tissue mobilization STM lumbar paraspinals and lateral border of sacrum            Trigger Point Dry Needling - 02/13/21 2117    Consent Given? Yes    Education Handout Provided No   verbal education provided   Muscles Treated Back/Hip Lumbar multifidi    Dry Needling Comments right side    Lumbar multifidi Response Palpable increased muscle length  PT Education - 02/13/21 2113    Education Details HEP progression    Person(s) Educated Patient    Methods Explanation;Demonstration;Handout    Comprehension Verbalized understanding;Returned demonstration               PT Long Term Goals - 01/27/21 1637      PT LONG TERM GOAL #1   Title The patient will be indep with HEP.    Time 6    Period Weeks    Target Date 03/10/21      PT LONG TERM GOAL #2   Title The patient will improve functional status score from 61% to > or equal to 73%.    Time 6    Period Weeks    Target Date 03/10/21      PT LONG TERM GOAL #3   Title The patient will report centralization of symptoms from R lateral leg to low back/SI region.    Time 6    Period Weeks    Target Date 03/10/21      PT LONG TERM GOAL #4   Title The patient will be able to stand at work with 50% reduction in pain.    Time 6    Period Weeks    Target Date 03/10/21                 Plan - 02/13/21 2114    Clinical Impression  Statement The patient notes most relief after mobs to SI joint last session.  PT continued with mobs, adding self mobilization to HEP, and continued to progress therapeutic exercise for stabilization in hips and core.  Also trialed dry needling for proximal lumbar multifidi due to some pain in R SI with palpation of paraspinals.  Plan to progress to patient tolerance.    PT Treatment/Interventions Taping;Patient/family education;ADLs/Self Care Home Management;Therapeutic activities;Therapeutic exercise;Manual techniques;Electrical Stimulation;Moist Heat;Traction;Dry needling    PT Next Visit Plan SI MET, manual as tolerated, DN if needed, core strength and endurance    PT Home Exercise Plan Access Code: [email or web address]    Consulted and Agree with Plan of Care Patient           Patient will benefit from skilled therapeutic intervention in order to improve the following deficits and impairments:     Visit Diagnosis: Acute right-sided low back pain without sciatica  Other symptoms and signs involving the musculoskeletal system     Problem List Patient Active Problem List   Diagnosis Date Noted  . Lumbar degenerative disc disease 01/20/2021  . Annual physical exam 09/02/2020  . Skin tag 09/02/2020  . Anxiety and depression 08/26/2020  . Abnormal weight gain 07/28/2020  . RESPIRATORY DISORDER, ACUTE 09/06/2010  . ACUTE BRONCHITIS 09/06/2010    [place], PT 02/13/2021, 9:35 PM  [facility] [address] [place], [place], [address] Phone: [phone]   Fax:  [phone]  Name: YARDLEY LEKAS, MD MRN: [id number] Date of Birth: 1986/05/18

## 2021-02-13 NOTE — Patient Instructions (Signed)
Access Code: [email or web address] URL: [email or web address] Date: 02/13/2021 Prepared by: Margretta Ditty  Exercises Prone Press Up - 2 x daily - 7 x weekly - 1 sets - 10 reps - 10 seconds hold Prone SIJ Anterior Rotation Mobilization on Table - 2 x daily - 7 x weekly - 1 sets - 5 reps - 10 seconds hold Cat-Camel to Child's Pose - 2 x daily - 7 x weekly - 1 sets - 10 reps Hip Flexor Stretch at Edge of Bed - 2 x daily - 7 x weekly - 1 sets - 3 reps - 30 seconds hold Supine SI Joint Self-Correction - 2 x daily - 7 x weekly - 1 sets - 5 reps - 5 seconds hold Squat with Chair Touch - 2 x daily - 7 x weekly - 1 sets - 10 reps Seated Figure 4 Piriformis Stretch - 2 x daily - 7 x weekly - 1 sets - 3 reps - 30 seconds hold Standing Back Flexion Stretch with Foot on Chair - 2 x daily - 7 x weekly - 1 sets - 3 reps - 30 seconds hold Single Leg Balance with Clock Reach - 2 x daily - 7 x weekly - 1 sets - 5 reps

## 2021-02-17 ENCOUNTER — Encounter: Payer: Self-pay | Admitting: Rehabilitative and Restorative Service Providers"

## 2021-02-17 ENCOUNTER — Other Ambulatory Visit: Payer: Self-pay

## 2021-02-17 ENCOUNTER — Ambulatory Visit (INDEPENDENT_AMBULATORY_CARE_PROVIDER_SITE_OTHER): Payer: 59 | Admitting: Rehabilitative and Restorative Service Providers"

## 2021-02-17 DIAGNOSIS — M545 Low back pain, unspecified: Secondary | ICD-10-CM

## 2021-02-17 DIAGNOSIS — R29898 Other symptoms and signs involving the musculoskeletal system: Secondary | ICD-10-CM | POA: Diagnosis not present

## 2021-02-17 NOTE — Therapy (Signed)
[facility] [place] [address] [place] [address] [place], [place], [address] Phone: [phone]   Fax:  [phone]  Physical Therapy Treatment  Patient Details  Name: Austin PAVAO, MD MRN: [id number] Date of Birth: 1986/06/30 Referring Provider (PT): Rodney Langton, MD   Encounter Date: 02/17/2021   PT End of Session - 02/17/21 1326    Visit Number 5    Number of Visits 12    Date for PT Re-Evaluation 03/10/21    Authorization Type UMR    PT Start Time 1318    PT Stop Time 1400    PT Time Calculation (min) 42 min    Activity Tolerance Patient tolerated treatment well    Behavior During Therapy [facility] for tasks assessed/performed           History reviewed. No pertinent past medical history.  History reviewed. No pertinent surgical history.  There were no vitals filed for this visit.   Subjective Assessment - 02/17/21 1323    Subjective The patient is doing much better overall.  He gets pain at lunch time or after hours of standing.    Patient Stated Goals reduce pain    Currently in Pain? No/denies              [facility] PT Assessment - 02/17/21 1330      Assessment   Medical Diagnosis lumbar degenerative disc disase with R leg radiating symptoms    Referring Provider (PT) Rodney Langton, MD    Onset Date/Surgical Date 01/20/21    Hand Dominance Left                         [facility] Adult PT Treatment/Exercise - 02/17/21 1330      Exercises   Exercises Lumbar;Knee/Hip      Lumbar Exercises: Stretches   Other Lumbar Stretch Exercise SIJ mobilization with L LE off edge of mat / R leg on mat with prone press up      Lumbar Exercises: Aerobic   Tread Mill 5 minutes x 2.4 mph      Lumbar Exercises: Standing   Forward Lunge 5 reps    Forward Lunge Limitations with 10 lb kettle bell overhead moving into lunge>high march>lunge *painful in L SI region      Lumbar Exercises: Sidelying   Other Sidelying  Lumbar Exercises side plank x 3 reps x 10 second holds      Knee/Hip Exercises: Standing   Hip Abduction Stengthening;Both;10 reps    Abduction Limitations x band walk with green band    SLS with contralateral leg swings (gets some pain after 3 reps R SI)    SLS with Vectors straight leg reach for mat table R and L *no longer painful on R side      Knee/Hip Exercises: Prone   Other Prone Exercises 8 point plank      Manual Therapy   Manual Therapy Joint mobilization;Soft tissue mobilization    Joint Mobilization SI grade 3 CPAs and UPAs to improve joint mobility; Lumbar CPA mobs    Soft tissue mobilization STM lateral border of sacrum                  PT Education - 02/17/21 1730    Education Details progression of HEP    Person(s) Educated Patient    Methods Explanation;Demonstration;Handout    Comprehension Verbalized understanding;Returned demonstration  PT Long Term Goals - 02/17/21 1731      PT LONG TERM GOAL #1   Title The patient will be indep with HEP.    Time 6    Period Weeks    Status On-going      PT LONG TERM GOAL #2   Title The patient will improve functional status score from 61% to > or equal to 73%.    Time 6    Period Weeks    Status On-going      PT LONG TERM GOAL #3   Title The patient will report centralization of symptoms from R lateral leg to low back/SI region.    Time 6    Period Weeks    Status Achieved      PT LONG TERM GOAL #4   Title The patient will be able to stand at work with 50% reduction in pain.    Time 6    Period Weeks    Status On-going                 Plan - 02/17/21 1744    Clinical Impression Statement The patient is improving with dec'd pain, improved strength and ability to perform single limb strength tasks without R SI/glut pain.  PT continuing to progress strengthening working towards LTGs.    PT Treatment/Interventions Taping;Patient/family education;ADLs/Self Care Home  Management;Therapeutic activities;Therapeutic exercise;Manual techniques;Electrical Stimulation;Moist Heat;Traction;Dry needling    PT Next Visit Plan progress strength, single limb stability, joint mobs, manual as needed    PT Home Exercise Plan Access Code: [email or web address]    Consulted and Agree with Plan of Care Patient           Patient will benefit from skilled therapeutic intervention in order to improve the following deficits and impairments:     Visit Diagnosis: Acute right-sided low back pain without sciatica  Other symptoms and signs involving the musculoskeletal system     Problem List Patient Active Problem List   Diagnosis Date Noted  . Lumbar degenerative disc disease 01/20/2021  . Annual physical exam 09/02/2020  . Skin tag 09/02/2020  . Anxiety and depression 08/26/2020  . Abnormal weight gain 07/28/2020  . RESPIRATORY DISORDER, ACUTE 09/06/2010  . ACUTE BRONCHITIS 09/06/2010    TRUE Garciamartinez, PT 02/17/2021, 5:45 PM  [facility] [address] [place] [address] [place], [place], [address] Phone: [phone]   Fax:  [phone]  Name: Austin OSBOURNE, MD MRN: [id number] Date of Birth: 12-05-1985

## 2021-02-17 NOTE — Patient Instructions (Signed)
Access Code: [email or web address] URL: [email or web address] Date: 02/17/2021 Prepared by: Margretta Ditty  Program Notes *   Exercises Prone SIJ Anterior Rotation Mobilization on Table - 2 x daily - 7 x weekly - 1 sets - 5 reps - 10 seconds hold Cat-Camel to Child's Pose - 2 x daily - 7 x weekly - 1 sets - 10 reps Hip Flexor Stretch at Edge of Bed - 2 x daily - 7 x weekly - 1 sets - 3 reps - 30 seconds hold Side Plank on Elbow - 1 x daily - 7 x weekly - 1 sets - 5 reps - 10 seconds hold Squat with Chair Touch - 2 x daily - 7 x weekly - 1 sets - 10 reps Seated Figure 4 Piriformis Stretch - 2 x daily - 7 x weekly - 1 sets - 3 reps - 30 seconds hold X Band Walk - 1 x daily - 7 x weekly - 1 sets - 10 reps Hip Swing - 1 x daily - 7 x weekly - 1 sets - 3-5 reps

## 2021-02-23 MED FILL — Bupropion HCl Tab ER 24HR 150 MG: ORAL | 30 days supply | Qty: 30 | Fill #1 | Status: AC

## 2021-02-24 ENCOUNTER — Other Ambulatory Visit (HOSPITAL_COMMUNITY): Payer: Self-pay

## 2021-02-24 ENCOUNTER — Encounter: Payer: 59 | Admitting: Physical Therapy

## 2021-03-03 ENCOUNTER — Other Ambulatory Visit: Payer: Self-pay

## 2021-03-03 ENCOUNTER — Encounter: Payer: Self-pay | Admitting: Rehabilitative and Restorative Service Providers"

## 2021-03-03 ENCOUNTER — Ambulatory Visit (INDEPENDENT_AMBULATORY_CARE_PROVIDER_SITE_OTHER): Payer: 59 | Admitting: Rehabilitative and Restorative Service Providers"

## 2021-03-03 DIAGNOSIS — M545 Low back pain, unspecified: Secondary | ICD-10-CM

## 2021-03-03 DIAGNOSIS — R29898 Other symptoms and signs involving the musculoskeletal system: Secondary | ICD-10-CM | POA: Diagnosis not present

## 2021-03-03 NOTE — Therapy (Signed)
[facility] [place] [address] [place] [address] [place], [place], [address] Phone: [phone]   Fax:  [phone]  Physical Therapy Treatment  Patient Details  Name: Austin SAINI, MD MRN: [id number] Date of Birth: January 14, 1986 Referring Provider (PT): Rodney Langton, MD   Encounter Date: 03/03/2021   PT End of Session - 03/03/21 1320    Visit Number 6    Number of Visits 12    Date for PT Re-Evaluation 03/10/21    Authorization Type UMR    PT Start Time 1315    PT Stop Time 1355    PT Time Calculation (min) 40 min    Activity Tolerance Patient tolerated treatment well    Behavior During Therapy [facility] for tasks assessed/performed           History reviewed. No pertinent past medical history.  History reviewed. No pertinent surgical history.  There were no vitals filed for this visit.   Subjective Assessment - 03/03/21 1319    Subjective The patient reports 60-70% better.  He has dropped the ibuprofen.  Flexion still bothers him at times.    Patient Stated Goals reduce pain    Currently in Pain? No/denies              [facility] PT Assessment - 03/03/21 1323      Assessment   Medical Diagnosis lumbar degenerative disc disase with R leg radiating symptoms    Referring Provider (PT) Rodney Langton, MD    Onset Date/Surgical Date 01/20/21    Hand Dominance Left      Ambulation/Gait   Ambulation/Gait --    Ambulation/Gait Assistance --                         [facility] Adult PT Treatment/Exercise - 03/03/21 1323      Exercises   Exercises Lumbar;Knee/Hip      Lumbar Exercises: Stretches   Lower Trunk Rotation 1 rep    Lower Trunk Rotation Limitations with strap    Other Lumbar Stretch Exercise supine lumbar rotation with overpressure from contralateral LE      Lumbar Exercises: Aerobic   Tread Mill 4 minutes at 2.8 mph for warmup      Lumbar Exercises: Prone   Other Prone Lumbar Exercises extended  plank to side plank with extended arms      Knee/Hip Exercises: Stretches   Active Hamstring Stretch Right;Left;2 reps;30 seconds    Active Hamstring Stretch Limitations trialed in standing-- patient with knee hyperextension -- modified to sitting edge of mat table;  also performed in supine wiht strap      Manual Therapy   Manual Therapy Joint mobilization    Joint Mobilization SI grade 3 CPAs and UPAs to improve joint mobility; Lumbar CPA mobs                  PT Education - 03/03/21 1349    Education Details HEP update    Person(s) Educated Patient    Methods Explanation;Demonstration;Handout    Comprehension Verbalized understanding;Returned demonstration               PT Long Term Goals - 03/03/21 1655      PT LONG TERM GOAL #1   Title The patient will be indep with HEP.    Time 6    Period Weeks    Status On-going    Target Date 03/10/21      PT LONG TERM GOAL #2  Title The patient will improve functional status score from 61% to > or equal to 73%.    Time 6    Period Weeks    Status On-going      PT LONG TERM GOAL #3   Title The patient will report centralization of symptoms from R lateral leg to low back/SI region.    Time 6    Period Weeks    Status Achieved      PT LONG TERM GOAL #4   Title The patient will be able to stand at work with 50% reduction in pain.    Baseline 60% improvement    Time 6    Period Weeks    Status Achieved                 Plan - 03/03/21 1656    Clinical Impression Statement The patient is continuing to progress with physical therapy.  PT modified ther ex for HEP and patient ot continue with home strengthening.    PT Treatment/Interventions Taping;Patient/family education;ADLs/Self Care Home Management;Therapeutic activities;Therapeutic exercise;Manual techniques;Electrical Stimulation;Moist Heat;Traction;Dry needling    PT Next Visit Plan Check goals and plan for d/c.  Progress HEP as needed, joint  mobilization.    PT Home Exercise Plan Access Code: [email or web address]    Consulted and Agree with Plan of Care Patient           Patient will benefit from skilled therapeutic intervention in order to improve the following deficits and impairments:  Pain,Impaired flexibility,Decreased range of motion,Increased fascial restricitons  Visit Diagnosis: Acute right-sided low back pain without sciatica  Other symptoms and signs involving the musculoskeletal system     Problem List Patient Active Problem List   Diagnosis Date Noted  . Lumbar degenerative disc disease 01/20/2021  . Annual physical exam 09/02/2020  . Skin tag 09/02/2020  . Anxiety and depression 08/26/2020  . Abnormal weight gain 07/28/2020  . RESPIRATORY DISORDER, ACUTE 09/06/2010  . ACUTE BRONCHITIS 09/06/2010    [place] Sautter, PT 03/03/2021, 5:01 PM  [facility] [address] [place] [address] [place], [place], [address] Phone: [phone]   Fax:  [phone]  Name: Austin TIGGES, MD MRN: [id number] Date of Birth: 29-Feb-1986

## 2021-03-03 NOTE — Patient Instructions (Addendum)
Access Code: [email or web address] URL: [email or web address] Date: 03/03/2021 Prepared by: Margretta Ditty  Program Notes *   Exercises Prone SIJ Anterior Rotation Mobilization on Table - 2 x daily - 7 x weekly - 1 sets - 5 reps - 10 seconds hold Cat-Camel to Child's Pose - 2 x daily - 7 x weekly - 1 sets - 10 reps Hip Flexor Stretch at Edge of Bed - 2 x daily - 7 x weekly - 1 sets - 3 reps - 30 seconds hold Seated Table Hamstring Stretch - 2 x daily - 7 x weekly - 1 sets - 2 reps - 30 seconds hold Side Plank on Elbow - 1 x daily - 7 x weekly - 1 sets - 5 reps - 10 seconds hold Supine ITB Stretch - 2 x daily - 7 x weekly - 1 sets - 3 reps Squat with Chair Touch - 2 x daily - 7 x weekly - 1 sets - 10 reps Seated Figure 4 Piriformis Stretch - 2 x daily - 7 x weekly - 1 sets - 3 reps - 30 seconds hold X Band Walk - 1 x daily - 7 x weekly - 1 sets - 10 reps Hip Swing - 1 x daily - 7 x weekly - 1 sets - 3-5 reps

## 2021-03-17 ENCOUNTER — Encounter: Payer: Self-pay | Admitting: Rehabilitative and Restorative Service Providers"

## 2021-03-17 ENCOUNTER — Other Ambulatory Visit: Payer: Self-pay

## 2021-03-17 ENCOUNTER — Ambulatory Visit (INDEPENDENT_AMBULATORY_CARE_PROVIDER_SITE_OTHER): Payer: 59 | Admitting: Rehabilitative and Restorative Service Providers"

## 2021-03-17 DIAGNOSIS — M545 Low back pain, unspecified: Secondary | ICD-10-CM

## 2021-03-17 DIAGNOSIS — R29898 Other symptoms and signs involving the musculoskeletal system: Secondary | ICD-10-CM | POA: Diagnosis not present

## 2021-03-17 NOTE — Therapy (Addendum)
[place], [place], [address] Phone: [phone]   Fax:  [phone]  Physical Therapy Treatment/Discharge Summary  Patient Details  Name: Austin WESTBERG, Austin [name] MRN: [id number] Date of Birth: [birth year] Referring Provider (PT): Aundria Mems, Austin [name]   PHYSICAL THERAPY DISCHARGE SUMMARY  Visits from Start of Care: 7  Current functional level related to goals / functional outcomes: See goals below   *The patient called to cancel last appt because of improvement in symptoms.   Encounter Date: 03/17/2021   PT End of Session - 03/17/21 1500     Visit Number 7    Number of Visits 12    Date for PT Re-Evaluation 04/16/21    Authorization Type UMR    Activity Tolerance Patient tolerated treatment well    Behavior During Therapy [facility] for tasks assessed/performed             History reviewed. No pertinent past medical history.  History reviewed. No pertinent surgical history.  There were no vitals filed for this visit.   Subjective Assessment - 03/17/21 1605     Subjective The patient reports he is feeling 90% better.  He continues with pain after softball pitching at daughter's practice and games.  Otherwise, he is improving.    Patient Stated Goals reduce pain    Currently in Pain? No/denies                [facility] PT Assessment - 03/17/21 1339       Assessment   Medical Diagnosis lumbar degenerative disc disase with R leg radiating symptoms    Referring Provider (PT) Aundria Mems, Austin [name]    Onset Date/Surgical Date 01/20/21                           [facility] Adult PT Treatment/Exercise - 03/17/21 1339       Self-Care   Self-Care Other Self-Care Comments    Other Self-Care Comments  discussed foam rolling after extended softball pitching      Lumbar Exercises: Stretches   Press Ups 5 reps;10 seconds      Lumbar Exercises: Seated   Other Seated Lumbar  Exercises seated segmental flexion and extension x 5 reps *some discomfort end range extension      Lumbar Exercises: Quadruped   Madcat/Old Horse 10 reps    Madcat/Old Horse Limitations tightness at end range extension    Other Quadruped Lumbar Exercises child's pose with lateral UE movement      Knee/Hip Exercises: Standing   Lunge Walking - Round Trips isometric high lunge with counterrotation x 10 reps discused adding resistance to progress; for static contraction hip stabilizers and core    Other Standing Knee Exercises standing anterior/posterior weight shifting      Manual Therapy   Manual Therapy Joint mobilization    Joint Mobilization SI grade 3 CPAs and UPAs to improve joint mobility; Lumbar CPA mobs                    PT Education - 03/17/21 1649     Education Details HEP updated    Person(s) Educated Patient    Methods Explanation;Demonstration;Handout    Comprehension Verbalized understanding;Returned demonstration                 PT Long Term Goals - 03/17/21 1650       PT LONG TERM GOAL #1   Title The patient  will be indep with HEP.    Time 6    Period Weeks    Status Revised    Target Date 04/16/21      PT LONG TERM GOAL #2   Title The patient will improve functional status score from 61% to > or equal to 73%.    Time 6    Period Weeks    Status Revised    Target Date 04/16/21      PT LONG TERM GOAL #3   Title The patient will report centralization of symptoms from R lateral leg to low back/SI region.    Time 6    Period Weeks    Status Achieved      PT LONG TERM GOAL #4   Title The patient will be able to stand at work with 50% reduction in pain.    Baseline 60% improvement-- now notes 90% improvement    Time 6    Period Weeks    Status Achieved                   Plan - 03/17/21 1650     Clinical Impression Statement The patient has partially met LTGs.  PT revised HEP goal to continue to progress working on end  range extension ROM, and continuing to progress hip staabilization/strengthening.  Patient reports 90% improvement in pain.  PT to progress towards remaining 2 LTGs with updated end date.    PT Frequency 1x / week    PT Duration 4 weeks    PT Treatment/Interventions Taping;Patient/family education;ADLs/Self Care Home Management;Therapeutic activities;Therapeutic exercise;Manual techniques;Electrical Stimulation;Moist Heat;Traction;Dry needling    PT Next Visit Plan Progress HEP, do FOTO, plan for d/c.    PT Home Exercise Plan Access Code: [email or web address]    Consulted and Agree with Plan of Care Patient             Patient will benefit from skilled therapeutic intervention in order to improve the following deficits and impairments:  Pain,Impaired flexibility,Decreased range of motion,Increased fascial restricitons  Visit Diagnosis: Acute right-sided low back pain without sciatica  Other symptoms and signs involving the musculoskeletal system     Problem List Patient Active Problem List   Diagnosis Date Noted   Lumbar degenerative disc disease 01/20/2021   Annual physical exam 09/02/2020   Skin tag 09/02/2020   Anxiety and depression 08/26/2020   Abnormal weight gain 07/28/2020   RESPIRATORY DISORDER, ACUTE 09/06/2010   ACUTE BRONCHITIS 09/06/2010    [place], PT 03/17/2021, 4:56 PM  [facility] [place], [place], [address] Phone: [phone]   Fax:  [phone]  Name: Austin DEYTON, Austin [name] MRN: [id number] Date of Birth: 1986/04/24

## 2021-03-17 NOTE — Patient Instructions (Signed)
Access Code: [email or web address] URL: [email or web address] Date: 03/17/2021 Prepared by: Margretta Ditty  Program Notes *   Exercises Prone SIJ Anterior Rotation Mobilization on Table - 2 x daily - 7 x weekly - 1 sets - 5 reps - 10 seconds hold Prone Press Up - 2 x daily - 7 x weekly - 1 sets - 10 reps Cat-Camel to Child's Pose - 2 x daily - 7 x weekly - 1 sets - 10 reps Hip Flexor Stretch at Edge of Bed - 2 x daily - 7 x weekly - 1 sets - 3 reps - 30 seconds hold Seated Table Hamstring Stretch - 2 x daily - 7 x weekly - 1 sets - 2 reps - 30 seconds hold Side Plank on Elbow - 1 x daily - 7 x weekly - 1 sets - 5 reps - 10 seconds hold Supine ITB Stretch - 2 x daily - 7 x weekly - 1 sets - 3 reps Squat with Chair Touch - 2 x daily - 7 x weekly - 1 sets - 10 reps Seated Figure 4 Piriformis Stretch - 2 x daily - 7 x weekly - 1 sets - 3 reps - 30 seconds hold X Band Walk - 1 x daily - 7 x weekly - 1 sets - 10 reps Hip Swing - 1 x daily - 7 x weekly - 1 sets - 3-5 reps Runner's Lunge - 2 x daily - 7 x weekly - 1 sets - 1 reps

## 2021-03-24 ENCOUNTER — Encounter: Payer: 59 | Admitting: Rehabilitative and Restorative Service Providers"

## 2021-03-25 MED FILL — Bupropion HCl Tab ER 24HR 150 MG: ORAL | 30 days supply | Qty: 30 | Fill #2 | Status: AC

## 2021-03-27 ENCOUNTER — Other Ambulatory Visit (HOSPITAL_COMMUNITY): Payer: Self-pay

## 2021-04-19 ENCOUNTER — Telehealth: Payer: Self-pay | Admitting: Sports Medicine

## 2021-04-19 DIAGNOSIS — Z20822 Contact with and (suspected) exposure to covid-19: Secondary | ICD-10-CM | POA: Insufficient documentation

## 2021-04-19 NOTE — Telephone Encounter (Signed)
Getting COVID antibodies per patient request.

## 2021-04-23 MED FILL — Bupropion HCl Tab ER 24HR 150 MG: ORAL | 30 days supply | Qty: 30 | Fill #3 | Status: AC

## 2021-04-24 ENCOUNTER — Other Ambulatory Visit (HOSPITAL_COMMUNITY): Payer: Self-pay

## 2021-04-25 ENCOUNTER — Other Ambulatory Visit (HOSPITAL_COMMUNITY): Payer: Self-pay

## 2021-05-17 ENCOUNTER — Other Ambulatory Visit (HOSPITAL_COMMUNITY): Payer: Self-pay

## 2021-05-17 ENCOUNTER — Telehealth: Payer: Self-pay

## 2021-05-17 ENCOUNTER — Other Ambulatory Visit: Payer: Self-pay | Admitting: Sports Medicine

## 2021-05-17 DIAGNOSIS — F341 Dysthymic disorder: Secondary | ICD-10-CM

## 2021-05-17 DIAGNOSIS — F419 Anxiety disorder, unspecified: Secondary | ICD-10-CM

## 2021-05-17 DIAGNOSIS — F32A Depression, unspecified: Secondary | ICD-10-CM

## 2021-05-17 MED ORDER — BUPROPION HCL ER (XL) 150 MG PO TB24: 150.0000 mg | ORAL_TABLET | Freq: Every day | ORAL | 3 refills | Status: DC

## 2021-05-17 MED ORDER — ESCITALOPRAM OXALATE 10 MG PO TABS: 10.0000 mg | ORAL_TABLET | Freq: Every day | ORAL | 3 refills | Status: DC

## 2021-05-17 MED ORDER — ESCITALOPRAM OXALATE 10 MG PO TABS
10.0000 mg | ORAL_TABLET | Freq: Every day | ORAL | 3 refills | Status: DC
  Filled 2021-05-17: qty 90, 90d supply, fill #0
  Filled 2021-08-16: qty 90, 90d supply, fill #1

## 2021-05-17 MED ORDER — BUPROPION HCL ER (XL) 150 MG PO TB24
150.0000 mg | ORAL_TABLET | Freq: Every day | ORAL | 3 refills | Status: DC
Start: 2021-05-17 — End: 2024-03-13
  Filled 2021-05-17: qty 90, 90d supply, fill #0
  Filled 2021-08-22: qty 90, 90d supply, fill #1
  Filled 2021-11-15: qty 90, 90d supply, fill #2

## 2021-05-17 NOTE — Telephone Encounter (Signed)
Updated PHQ

## 2021-05-18 ENCOUNTER — Other Ambulatory Visit (HOSPITAL_COMMUNITY): Payer: Self-pay

## 2021-08-17 ENCOUNTER — Other Ambulatory Visit (HOSPITAL_COMMUNITY): Payer: Self-pay

## 2021-08-23 ENCOUNTER — Other Ambulatory Visit (HOSPITAL_COMMUNITY): Payer: Self-pay

## 2021-09-13 ENCOUNTER — Other Ambulatory Visit (HOSPITAL_BASED_OUTPATIENT_CLINIC_OR_DEPARTMENT_OTHER): Payer: Self-pay

## 2021-09-13 ENCOUNTER — Other Ambulatory Visit: Payer: Self-pay | Admitting: Sports Medicine

## 2021-09-13 DIAGNOSIS — Z20828 Contact with and (suspected) exposure to other viral communicable diseases: Secondary | ICD-10-CM | POA: Insufficient documentation

## 2021-09-13 MED ORDER — OSELTAMIVIR PHOSPHATE 75 MG PO CAPS
75.0000 mg | ORAL_CAPSULE | Freq: Every day | ORAL | 0 refills | Status: DC
  Filled 2021-09-13: qty 10, 10d supply, fill #0

## 2021-09-13 NOTE — Assessment & Plan Note (Signed)
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

## 2021-11-16 ENCOUNTER — Other Ambulatory Visit (HOSPITAL_COMMUNITY): Payer: Self-pay

## 2022-01-09 ENCOUNTER — Telehealth: Payer: Self-pay | Admitting: Sports Medicine

## 2022-01-09 ENCOUNTER — Other Ambulatory Visit (HOSPITAL_COMMUNITY): Payer: Self-pay

## 2022-01-09 DIAGNOSIS — R635 Abnormal weight gain: Secondary | ICD-10-CM

## 2022-01-09 MED ORDER — WEGOVY 0.5 MG/0.5ML ~~LOC~~ SOAJ
0.5000 mg | SUBCUTANEOUS | 0 refills | Status: DC
  Filled 2022-01-09: qty 2, fill #0
  Filled 2022-02-23: qty 2, 28d supply, fill #0

## 2022-01-09 MED ORDER — WEGOVY 1 MG/0.5ML ~~LOC~~ SOAJ
1.0000 mg | SUBCUTANEOUS | 0 refills | Status: DC
Start: 2022-01-09 — End: 2024-03-13
  Filled 2022-01-09: qty 2, fill #0

## 2022-01-09 MED ORDER — WEGOVY 0.25 MG/0.5ML ~~LOC~~ SOAJ
0.2500 mg | SUBCUTANEOUS | 0 refills | Status: DC
  Filled 2022-01-09: qty 2, 30d supply, fill #0

## 2022-01-09 NOTE — Assessment & Plan Note (Signed)
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 ?He tried multiple other weight loss modalities, without success.  He has done well with Wegovy in the past. ?

## 2022-01-09 NOTE — Telephone Encounter (Signed)
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 ?He tried multiple other weight loss modalities, without success.  He has done well with Wegovy in the past. ? ?

## 2022-01-10 ENCOUNTER — Encounter: Payer: Self-pay | Admitting: Sports Medicine

## 2022-01-10 DIAGNOSIS — F909 Attention-deficit hyperactivity disorder, unspecified type: Secondary | ICD-10-CM | POA: Insufficient documentation

## 2022-01-11 ENCOUNTER — Telehealth: Payer: Self-pay | Admitting: Sports Medicine

## 2022-01-11 ENCOUNTER — Telehealth: Payer: Self-pay

## 2022-01-11 DIAGNOSIS — M5136 Other intervertebral disc degeneration, lumbar region: Secondary | ICD-10-CM

## 2022-01-11 DIAGNOSIS — M51369 Other intervertebral disc degeneration, lumbar region without mention of lumbar back pain or lower extremity pain: Secondary | ICD-10-CM

## 2022-01-11 MED ORDER — PREDNISONE 50 MG PO TABS: ORAL_TABLET | ORAL | 0 refills | Status: DC

## 2022-01-11 NOTE — Telephone Encounter (Addendum)
Initiated Prior authorization QMG:QQPYPP 0.25MG /0.5ML auto-injectors ?Via: Covermymeds ?Case/Key:[id number] ?Status: approved as of 01/11/22 ?Reason:)These authorizations are effective 01/11/2022 through 04/11/2022. ?Notified Pt via: Mychart ?

## 2022-01-11 NOTE — Assessment & Plan Note (Signed)
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 ?

## 2022-01-11 NOTE — Telephone Encounter (Signed)
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 ? ?

## 2022-01-13 ENCOUNTER — Other Ambulatory Visit (HOSPITAL_COMMUNITY): Payer: Self-pay

## 2022-01-18 ENCOUNTER — Other Ambulatory Visit (HOSPITAL_COMMUNITY): Payer: Self-pay

## 2022-01-19 ENCOUNTER — Ambulatory Visit (INDEPENDENT_AMBULATORY_CARE_PROVIDER_SITE_OTHER): Payer: 59 | Admitting: Sports Medicine

## 2022-01-19 DIAGNOSIS — F909 Attention-deficit hyperactivity disorder, unspecified type: Secondary | ICD-10-CM | POA: Diagnosis not present

## 2022-01-19 DIAGNOSIS — R635 Abnormal weight gain: Secondary | ICD-10-CM

## 2022-01-19 DIAGNOSIS — F419 Anxiety disorder, unspecified: Secondary | ICD-10-CM

## 2022-01-19 DIAGNOSIS — F32A Depression, unspecified: Secondary | ICD-10-CM

## 2022-01-19 MED ORDER — ONDANSETRON 8 MG PO TBDP: 8.0000 mg | ORAL_TABLET | Freq: Three times a day (TID) | ORAL | 3 refills | Status: DC | PRN

## 2022-01-19 MED ORDER — AMPHETAMINE-DEXTROAMPHET ER 10 MG PO CP24: 10.0000 mg | ORAL_CAPSULE | Freq: Every day | ORAL | 0 refills | Status: DC

## 2022-01-19 NOTE — Progress Notes (Signed)
? ? ?  Procedures performed today:   ? ?None. ? ?Independent interpretation of notes and tests performed by another provider:  ? ?None. ? ?Brief History, Exam, Impression, and Recommendations:   ? 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 ?He has tried Theatre manager in the past without sufficient relief, he is also using bupropion without sufficient improvement in inattentive symptoms. ?For this reason we will go ahead and start Adderall XR 10 mg with a 2-week course, he can message me or stop me in the hallway and let me know in 2-week intervals how he would like to titrate up on the dose. ? ?Anxiety and depression ?Self discontinued Lexapro, Wellbutrin sufficiently efficacious, return as needed. ? ?Abnormal weight gain ?Just got the [place] shipment, plans to start it soon. ? ?Chronic processes with exacerbation and pharmacologic intervention ? ?___________________________________________ ?Ihor Austin. Benjamin Stain, M.D., ABFM., CAQSM. ?Primary Care and Sports Medicine ?[facility] ? ?Adjunct Instructor of Family Medicine  ?[employer] ?

## 2022-01-19 NOTE — Assessment & Plan Note (Signed)
Self discontinued Lexapro, Wellbutrin sufficiently efficacious, return as needed. ?

## 2022-01-19 NOTE — Assessment & Plan Note (Signed)
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 ?He has tried Theatre manager in the past without sufficient relief, he is also using bupropion without sufficient improvement in inattentive symptoms. ?For this reason we will go ahead and start Adderall XR 10 mg with a 2-week course, he can message me or stop me in the hallway and let me know in 2-week intervals how he would like to titrate up on the dose. ?

## 2022-01-19 NOTE — Assessment & Plan Note (Signed)
Just got the [place] shipment, plans to start it soon. ?

## 2022-02-08 ENCOUNTER — Other Ambulatory Visit: Payer: Self-pay | Admitting: Sports Medicine

## 2022-02-08 DIAGNOSIS — F909 Attention-deficit hyperactivity disorder, unspecified type: Secondary | ICD-10-CM

## 2022-02-08 MED ORDER — AMPHETAMINE-DEXTROAMPHET ER 15 MG PO CP24: 15.0000 mg | ORAL_CAPSULE | Freq: Every day | ORAL | 0 refills | Status: DC

## 2022-02-08 NOTE — Assessment & Plan Note (Signed)
Increasing to Adderall XR 15 mg. ?

## 2022-02-12 ENCOUNTER — Telehealth: Payer: Self-pay | Admitting: Sports Medicine

## 2022-02-12 DIAGNOSIS — B349 Viral infection, unspecified: Secondary | ICD-10-CM

## 2022-02-12 DIAGNOSIS — Z20828 Contact with and (suspected) exposure to other viral communicable diseases: Secondary | ICD-10-CM | POA: Insufficient documentation

## 2022-02-12 MED ORDER — ERYTHROMYCIN 5 MG/GM OP OINT: 1.0000 "application " | TOPICAL_OINTMENT | Freq: Three times a day (TID) | OPHTHALMIC | 0 refills | Status: AC

## 2022-02-12 MED ORDER — PREDNISONE 50 MG PO TABS: 50.0000 mg | ORAL_TABLET | Freq: Every day | ORAL | 0 refills | Status: DC

## 2022-02-12 MED ORDER — OSELTAMIVIR PHOSPHATE 75 MG PO CAPS: 75.0000 mg | ORAL_CAPSULE | Freq: Two times a day (BID) | ORAL | 0 refills | Status: DC

## 2022-02-12 NOTE — Assessment & Plan Note (Signed)
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  Home COVID test negative, adding Tamiflu, prednisone, topical erythromycin ophthalmic. ? ?

## 2022-02-12 NOTE — Telephone Encounter (Signed)
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  Home COVID test negative, adding Tamiflu, prednisone, topical erythromycin ophthalmic. ? ?

## 2022-02-13 ENCOUNTER — Other Ambulatory Visit: Payer: Self-pay | Admitting: Sports Medicine

## 2022-02-13 DIAGNOSIS — M51369 Other intervertebral disc degeneration, lumbar region without mention of lumbar back pain or lower extremity pain: Secondary | ICD-10-CM

## 2022-02-13 DIAGNOSIS — M5136 Other intervertebral disc degeneration, lumbar region: Secondary | ICD-10-CM

## 2022-02-13 MED ORDER — GABAPENTIN 300 MG PO CAPS: 300.0000 mg | ORAL_CAPSULE | Freq: Three times a day (TID) | ORAL | 11 refills | Status: DC

## 2022-02-23 ENCOUNTER — Other Ambulatory Visit (HOSPITAL_COMMUNITY): Payer: Self-pay

## 2022-03-05 ENCOUNTER — Telehealth: Payer: Self-pay | Admitting: Sports Medicine

## 2022-03-05 DIAGNOSIS — M5136 Other intervertebral disc degeneration, lumbar region: Secondary | ICD-10-CM

## 2022-03-05 DIAGNOSIS — M51369 Other intervertebral disc degeneration, lumbar region without mention of lumbar back pain or lower extremity pain: Secondary | ICD-10-CM

## 2022-03-05 NOTE — Assessment & Plan Note (Signed)
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 ?

## 2022-03-05 NOTE — Telephone Encounter (Signed)
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 ? ?(Eliseo Gum, Dr. [name] is hoping to do this in the noon slot tomorrow to work around his clinic) ?

## 2022-03-06 ENCOUNTER — Other Ambulatory Visit: Payer: Self-pay | Admitting: Sports Medicine

## 2022-03-06 ENCOUNTER — Ambulatory Visit (INDEPENDENT_AMBULATORY_CARE_PROVIDER_SITE_OTHER): Payer: 59

## 2022-03-06 DIAGNOSIS — M545 Low back pain, unspecified: Secondary | ICD-10-CM | POA: Diagnosis not present

## 2022-03-06 DIAGNOSIS — M51369 Other intervertebral disc degeneration, lumbar region without mention of lumbar back pain or lower extremity pain: Secondary | ICD-10-CM

## 2022-03-06 DIAGNOSIS — M5136 Other intervertebral disc degeneration, lumbar region: Secondary | ICD-10-CM

## 2022-03-06 DIAGNOSIS — R200 Anesthesia of skin: Secondary | ICD-10-CM | POA: Diagnosis not present

## 2022-03-06 DIAGNOSIS — M5416 Radiculopathy, lumbar region: Secondary | ICD-10-CM | POA: Diagnosis not present

## 2022-03-06 MED ORDER — TRAMADOL HCL 50 MG PO TABS: 50.0000 mg | ORAL_TABLET | Freq: Three times a day (TID) | ORAL | 0 refills | Status: DC | PRN

## 2022-03-06 MED ORDER — CYCLOBENZAPRINE HCL 10 MG PO TABS: ORAL_TABLET | ORAL | 11 refills | Status: DC

## 2022-03-06 NOTE — Telephone Encounter (Signed)
Per insurance, no prior auth or pre-certification required for procedure. Ref #[id number]. Patient has been updated of current information.  ?

## 2022-03-07 ENCOUNTER — Telehealth: Payer: Self-pay | Admitting: Sports Medicine

## 2022-03-07 DIAGNOSIS — M5136 Other intervertebral disc degeneration, lumbar region: Secondary | ICD-10-CM

## 2022-03-07 DIAGNOSIS — M51369 Other intervertebral disc degeneration, lumbar region without mention of lumbar back pain or lower extremity pain: Secondary | ICD-10-CM

## 2022-03-07 NOTE — Assessment & Plan Note (Signed)
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 ?He does have some right L4 and L5 nerve root impingement. ?Axial pain is discogenic. ?At this point he has failed conservative treatment so we will proceed with a right-sided L4-L5 interlaminar epidural. ?Of note on exam approximately 25% of his discomfort is anteriorly and reproducible with internal rotation of the right hip, and 75% is posteriorly likely referable to his lumbar spine. ?He will continue his tramadol, Voltaren, and Flexeril as needed. ?Follow-up 1 month after injection. ?Certainly we can address his hip joint if he has persistent and recurrent pain in the anterior groin. ?

## 2022-03-07 NOTE — Telephone Encounter (Signed)
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 ?He does have some right L4 and L5 nerve root impingement. ?Axial pain is discogenic. ?At this point he has failed conservative treatment so we will proceed with a right-sided L4-L5 interlaminar epidural. ?Of note on exam approximately 25% of his discomfort is anteriorly and reproducible with internal rotation of the right hip, and 75% is posteriorly likely referable to his lumbar spine. ?He will continue his tramadol, Voltaren, and Flexeril as needed. ?Follow-up 1 month after injection. ?Certainly we can address his hip joint if he has persistent and recurrent pain in the anterior groin. ? ? ?

## 2022-03-09 ENCOUNTER — Other Ambulatory Visit: Payer: Self-pay | Admitting: Sports Medicine

## 2022-03-09 DIAGNOSIS — B349 Viral infection, unspecified: Secondary | ICD-10-CM

## 2022-03-14 ENCOUNTER — Other Ambulatory Visit: Payer: Self-pay | Admitting: Sports Medicine

## 2022-03-14 ENCOUNTER — Ambulatory Visit
Admission: RE | Admit: 2022-03-14 | Discharge: 2022-03-14 | Disposition: A | Discharge status: Home / Self Care | Payer: 59 | Source: Ambulatory Visit | Attending: Sports Medicine | Admitting: Sports Medicine

## 2022-03-14 DIAGNOSIS — M47817 Spondylosis without myelopathy or radiculopathy, lumbosacral region: Secondary | ICD-10-CM | POA: Diagnosis not present

## 2022-03-14 DIAGNOSIS — M5136 Other intervertebral disc degeneration, lumbar region: Secondary | ICD-10-CM

## 2022-03-14 DIAGNOSIS — G8929 Other chronic pain: Secondary | ICD-10-CM

## 2022-03-14 IMAGING — XA Imaging study
2 series · 2 of 2 positions shown · non-contrast
Comparison: none

CLINICAL DATA: Lumbosacral spondylosis without myelopathy.
Right-sided low back and leg pain. No prior injections or surgery.
L4-L5 epidural injection requested.

[Series 1: ortho adipose · 1 of 1 slices shown (1 of 2)]
[im 1/1]
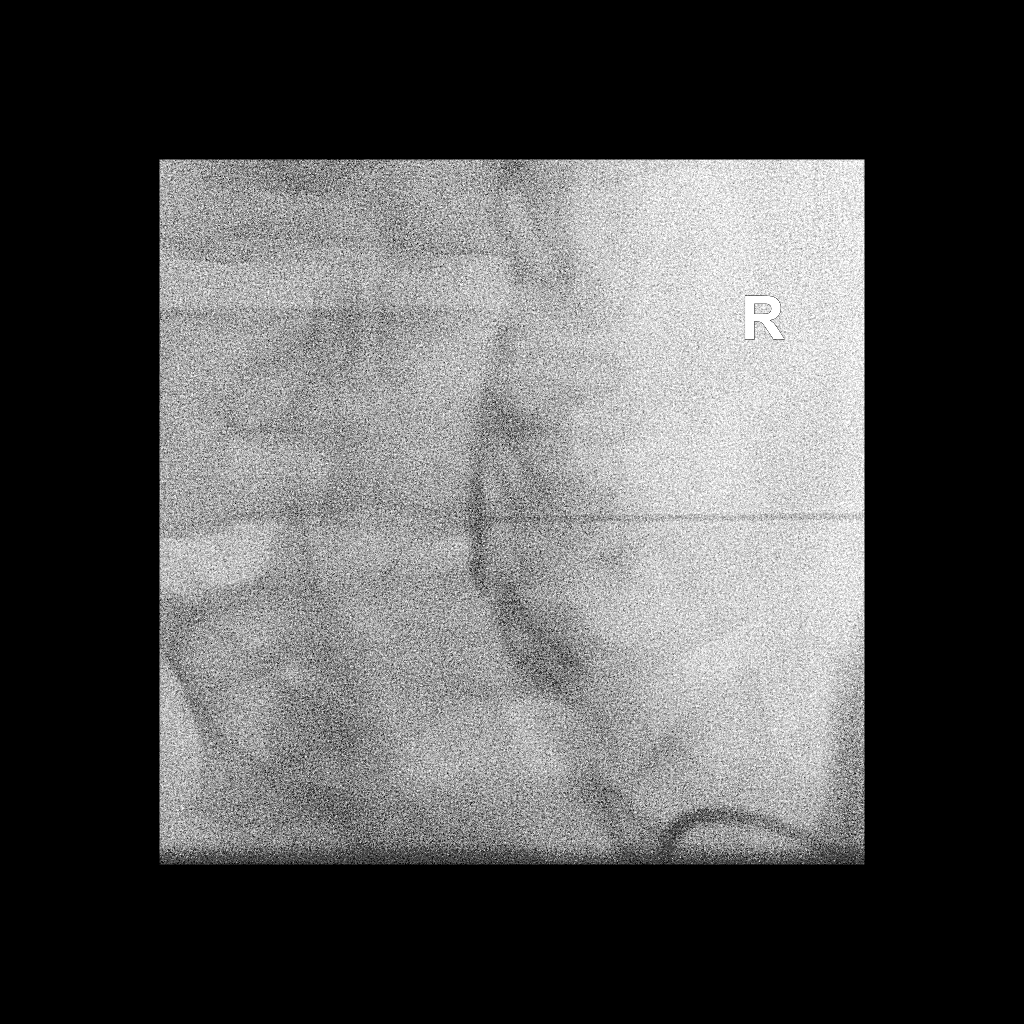

[Series 2: ortho adipose · 1 of 1 slices shown (2 of 2)]
[im 1/1]
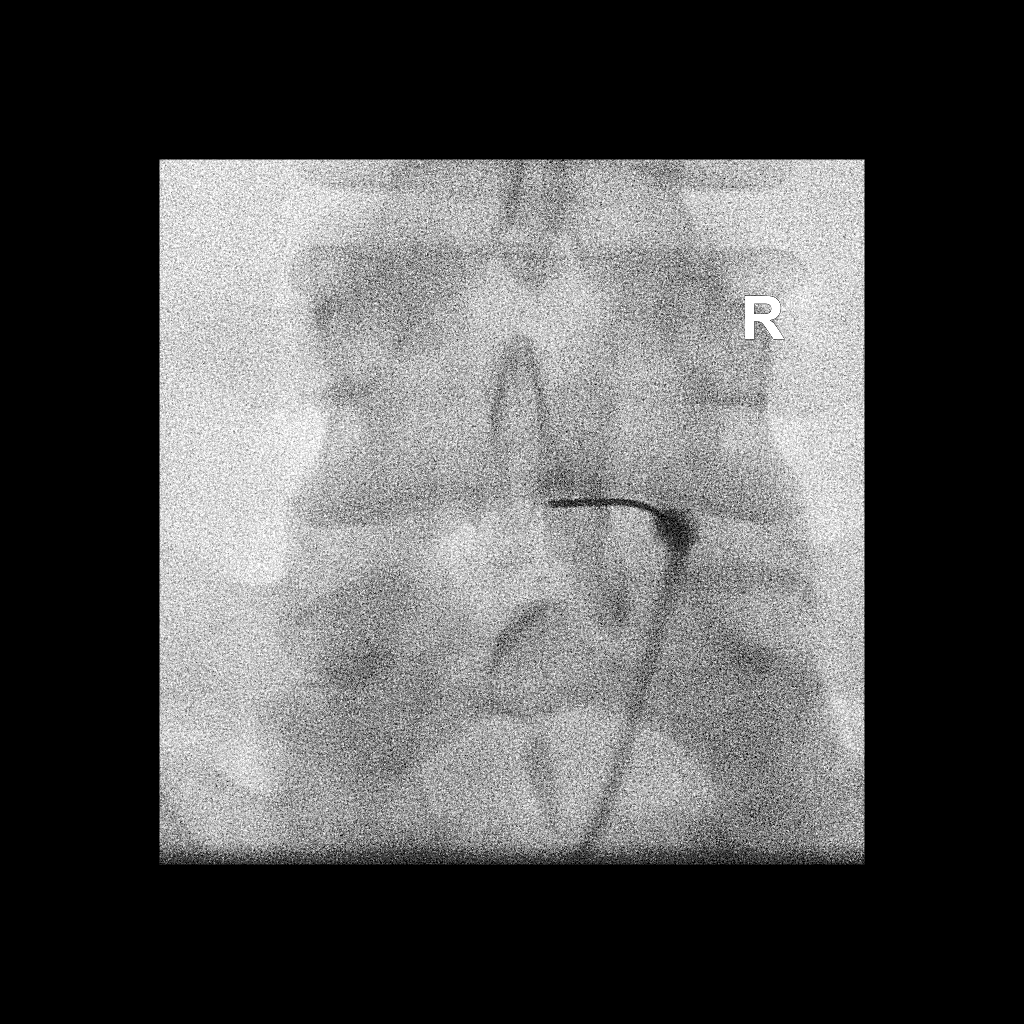

[2 of 2 positions shown; findings below may reference images not displayed]

FLUOROSCOPY:
Radiation Exposure Index (as provided by the fluoroscopic device):
5.6 mGy Kerma

PROCEDURE:
The procedure, risks, benefits, and alternatives were explained to
the patient. Questions regarding the procedure were encouraged and
answered. The patient understands and consents to the procedure.

LUMBAR EPIDURAL INJECTION:

An interlaminar approach was performed on the right at L4-L5. The
overlying skin was cleansed and anesthetized. A 6 inch 20 gauge
epidural needle was advanced using loss-of-resistance technique.

DIAGNOSTIC EPIDURAL INJECTION:

Injection of Isovue-M 200 shows a good epidural pattern with spread
above and below the level of needle placement, primarily on the
right. No vascular opacification is seen.

THERAPEUTIC EPIDURAL INJECTION:

80 mg of Depo-Medrol mixed with 3 mL of 1% lidocaine were instilled.
The procedure was well-tolerated, and the patient was discharged
thirty minutes following the injection in good condition.

COMPLICATIONS:
None immediate.
IMPRESSION: Technically successful interlaminar epidural injection on the right
at L4-L5.

## 2022-03-14 MED ORDER — METHYLPREDNISOLONE ACETATE 40 MG/ML INJ SUSP (RADIOLOG
80.0000 mg | Freq: Once | INTRAMUSCULAR | Status: AC
  Administered 2022-03-14: 80 mg via EPIDURAL

## 2022-03-14 MED ORDER — IOPAMIDOL (ISOVUE-M 200) INJECTION 41%
1.0000 mL | Freq: Once | INTRAMUSCULAR | Status: AC
Start: 2022-03-14 — End: 2022-03-14
  Administered 2022-03-14: 1 mL via EPIDURAL

## 2022-03-14 NOTE — Discharge Instructions (Signed)

## 2022-03-15 ENCOUNTER — Other Ambulatory Visit: Payer: Self-pay | Admitting: Sports Medicine

## 2022-03-15 DIAGNOSIS — M5136 Other intervertebral disc degeneration, lumbar region: Secondary | ICD-10-CM

## 2022-03-16 ENCOUNTER — Other Ambulatory Visit: Payer: Self-pay | Admitting: Sports Medicine

## 2022-03-16 ENCOUNTER — Other Ambulatory Visit (HOSPITAL_COMMUNITY): Payer: Self-pay

## 2022-03-16 ENCOUNTER — Ambulatory Visit (INDEPENDENT_AMBULATORY_CARE_PROVIDER_SITE_OTHER): Payer: 59

## 2022-03-16 DIAGNOSIS — M5136 Other intervertebral disc degeneration, lumbar region: Secondary | ICD-10-CM

## 2022-03-16 DIAGNOSIS — G8929 Other chronic pain: Secondary | ICD-10-CM | POA: Diagnosis not present

## 2022-03-16 DIAGNOSIS — F909 Attention-deficit hyperactivity disorder, unspecified type: Secondary | ICD-10-CM

## 2022-03-16 DIAGNOSIS — M51369 Other intervertebral disc degeneration, lumbar region without mention of lumbar back pain or lower extremity pain: Secondary | ICD-10-CM

## 2022-03-16 DIAGNOSIS — M25551 Pain in right hip: Secondary | ICD-10-CM | POA: Diagnosis not present

## 2022-03-16 DIAGNOSIS — R1030 Lower abdominal pain, unspecified: Secondary | ICD-10-CM | POA: Diagnosis not present

## 2022-03-16 IMAGING — DX DG HIP (WITH OR WITHOUT PELVIS) 2-3V*R*
3 series · 3 of 3 positions shown · non-contrast
Comparison: None Available.

CLINICAL DATA: Groin pain.

EXAM:
DG HIP (WITH OR WITHOUT PELVIS) 2-3V RIGHT

[pelvis ap]
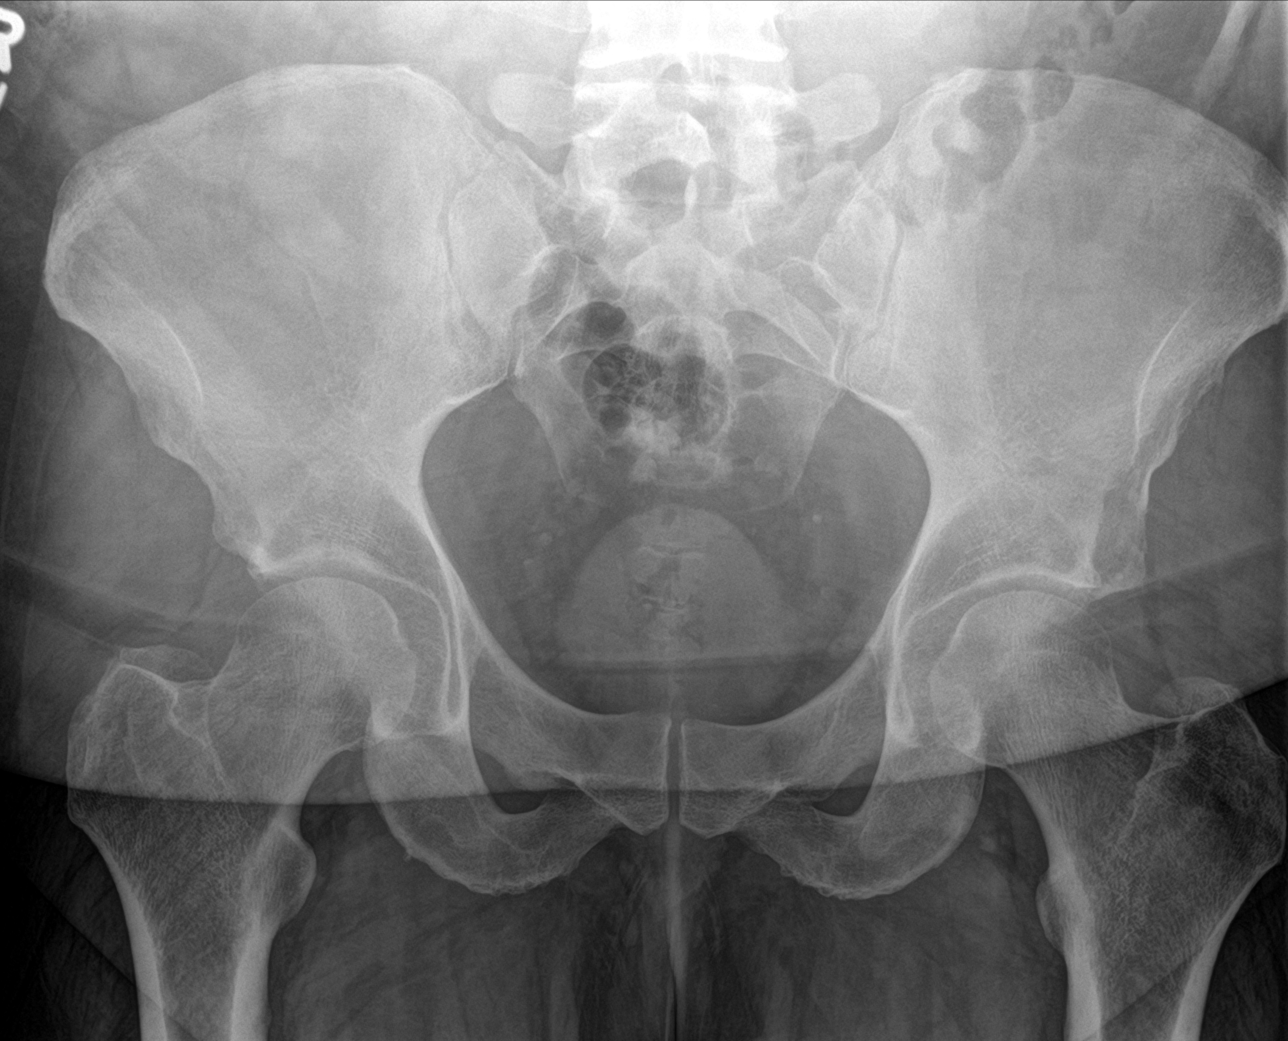

[hip ap]
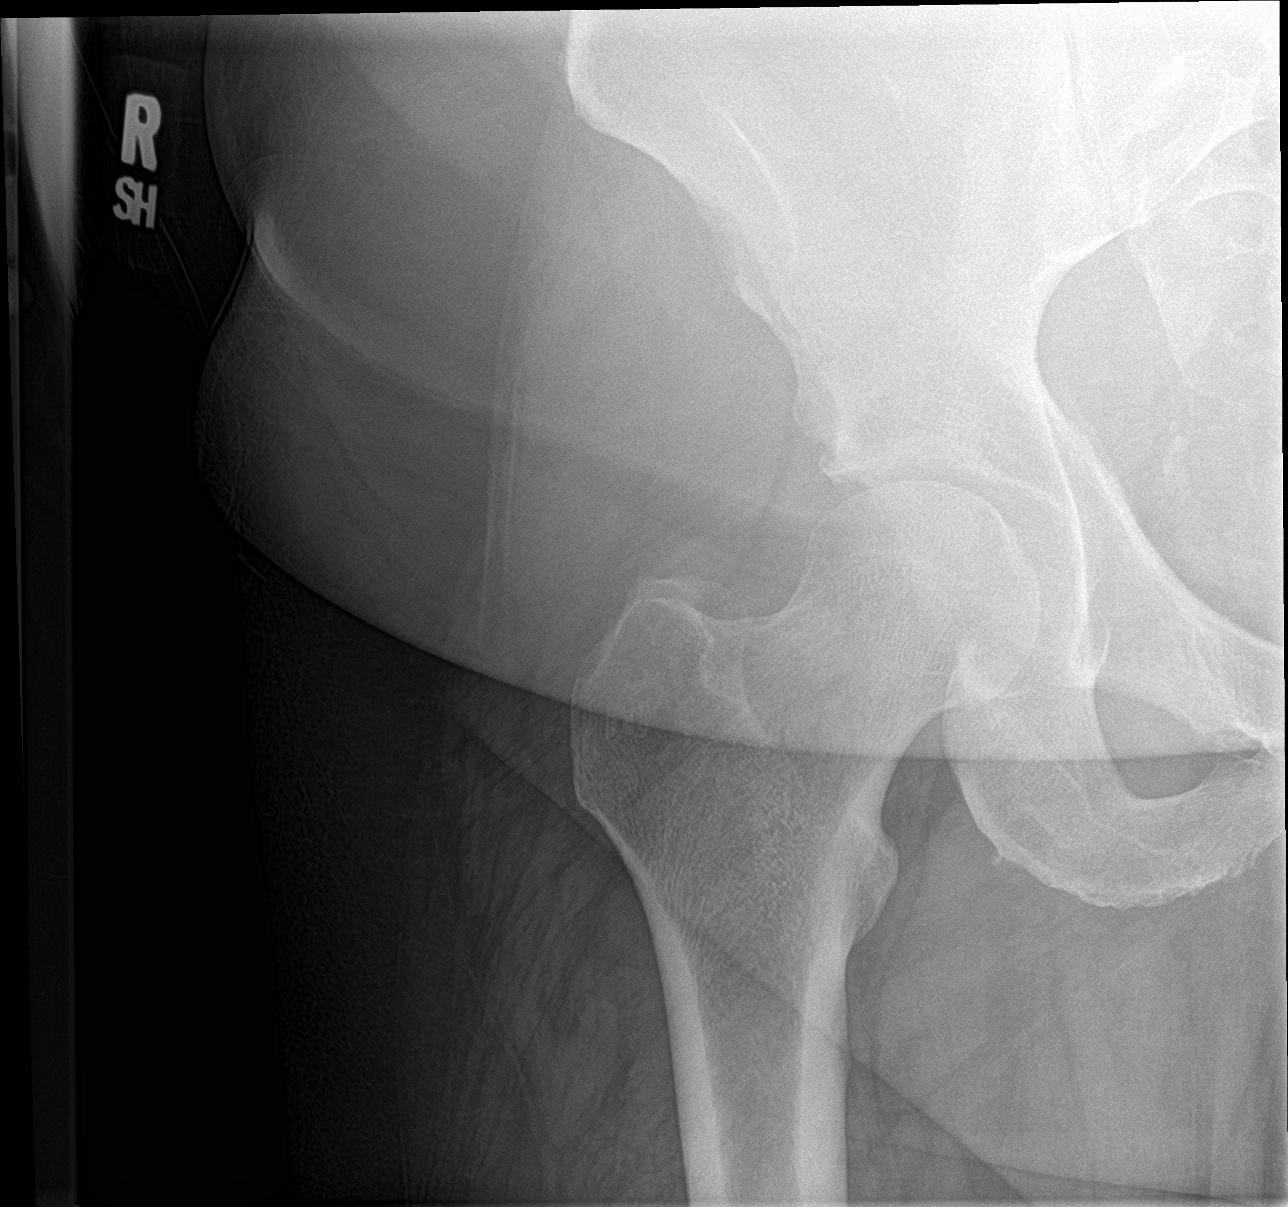

[hip lat]
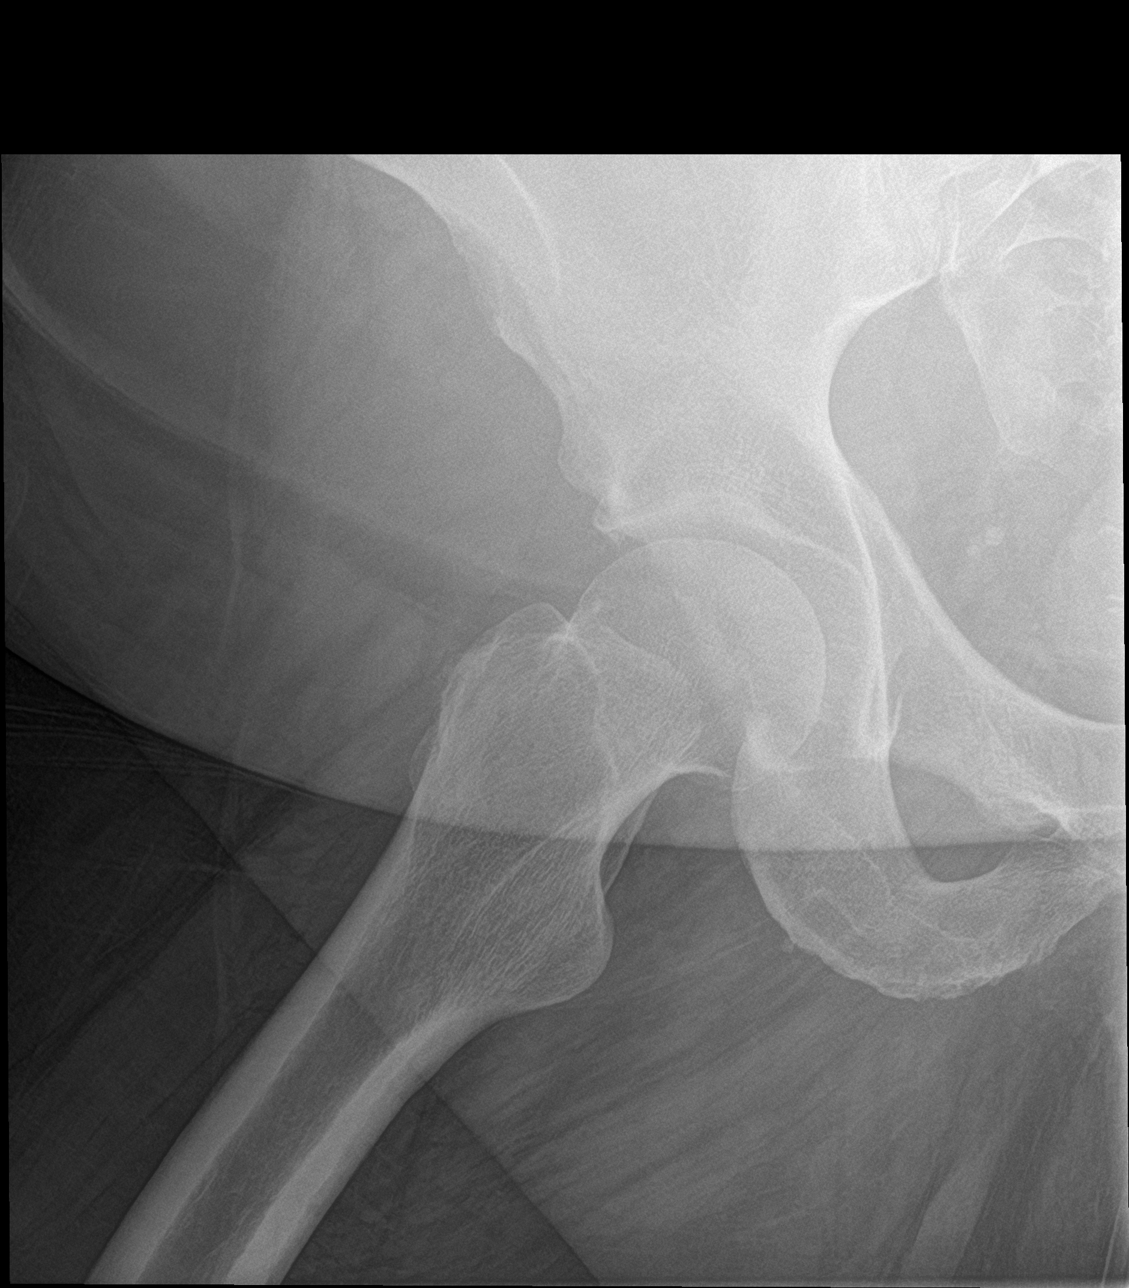

[3 of 3 positions shown; findings below may reference images not displayed]

FINDINGS: There is no evidence of hip fracture or dislocation. There is no
evidence of arthropathy or other focal bone abnormality.
IMPRESSION: Negative.

## 2022-03-16 MED ORDER — AMPHETAMINE-DEXTROAMPHET ER 15 MG PO CP24: 15.0000 mg | ORAL_CAPSULE | Freq: Every day | ORAL | 0 refills | Status: DC

## 2022-03-16 MED ORDER — TRAMADOL HCL 50 MG PO TABS: 50.0000 mg | ORAL_TABLET | Freq: Three times a day (TID) | ORAL | 0 refills | Status: DC | PRN

## 2022-03-16 MED ORDER — AMPHETAMINE-DEXTROAMPHET ER 15 MG PO CP24
15.0000 mg | ORAL_CAPSULE | Freq: Every day | ORAL | 0 refills | Status: DC
  Filled 2022-03-16: qty 90, 90d supply, fill #0

## 2022-03-21 ENCOUNTER — Ambulatory Visit (INDEPENDENT_AMBULATORY_CARE_PROVIDER_SITE_OTHER): Payer: 59 | Admitting: Sports Medicine

## 2022-03-21 DIAGNOSIS — M51369 Other intervertebral disc degeneration, lumbar region without mention of lumbar back pain or lower extremity pain: Secondary | ICD-10-CM

## 2022-03-21 DIAGNOSIS — R112 Nausea with vomiting, unspecified: Secondary | ICD-10-CM | POA: Diagnosis not present

## 2022-03-21 DIAGNOSIS — R197 Diarrhea, unspecified: Secondary | ICD-10-CM

## 2022-03-21 DIAGNOSIS — M5136 Other intervertebral disc degeneration, lumbar region: Secondary | ICD-10-CM | POA: Diagnosis not present

## 2022-03-21 LAB — CBC WITH DIFFERENTIAL/PLATELET
Absolute Monocytes: 1879 cells/uL — ABNORMAL HIGH (ref 200–950)
Basophils Absolute: 24 cells/uL (ref 0–200)
Basophils Relative: 0.2 %
Eosinophils Absolute: 12 cells/uL — ABNORMAL LOW (ref 15–500)
Eosinophils Relative: 0.1 %
HCT: 50.2 % — ABNORMAL HIGH (ref 38.5–50.0)
Hemoglobin: 16.8 g/dL (ref 13.2–17.1)
Lymphs Abs: 952 cells/uL (ref 850–3900)
MCH: 26.9 pg — ABNORMAL LOW (ref 27.0–33.0)
MCHC: 33.5 g/dL (ref 32.0–36.0)
MCV: 80.4 fL (ref 80.0–100.0)
MPV: 9.4 fL (ref 7.5–12.5)
Monocytes Relative: 15.4 %
Neutro Abs: 9333 cells/uL — ABNORMAL HIGH (ref 1500–7800)
Neutrophils Relative %: 76.5 %
Platelets: 345 10*3/uL (ref 140–400)
RBC: 6.24 10*6/uL — ABNORMAL HIGH (ref 4.20–5.80)
RDW: 14.3 % (ref 11.0–15.0)
Total Lymphocyte: 7.8 %
WBC: 12.2 10*3/uL — ABNORMAL HIGH (ref 3.8–10.8)

## 2022-03-21 LAB — COMPLETE METABOLIC PANEL WITH GFR
AG Ratio: 1.5 (calc) (ref 1.0–2.5)
ALT: 66 U/L — ABNORMAL HIGH (ref 9–46)
AST: 35 U/L (ref 10–40)
Albumin: 4.8 g/dL (ref 3.6–5.1)
Alkaline phosphatase (APISO): 51 U/L (ref 36–130)
BUN/Creatinine Ratio: 27 (calc) — ABNORMAL HIGH (ref 6–22)
BUN: 41 mg/dL — ABNORMAL HIGH (ref 7–25)
CO2: 26 mmol/L (ref 20–32)
Calcium: 9.9 mg/dL (ref 8.6–10.3)
Chloride: 96 mmol/L — ABNORMAL LOW (ref 98–110)
Creat: 1.53 mg/dL — ABNORMAL HIGH (ref 0.60–1.26)
Globulin: 3.3 g/dL (calc) (ref 1.9–3.7)
Glucose, Bld: 106 mg/dL — ABNORMAL HIGH (ref 65–99)
Potassium: 3.8 mmol/L (ref 3.5–5.3)
Sodium: 136 mmol/L (ref 135–146)
Total Bilirubin: 0.8 mg/dL (ref 0.2–1.2)
Total Protein: 8.1 g/dL (ref 6.1–8.1)
eGFR: 59 mL/min/{1.73_m2} — ABNORMAL LOW (ref >=60)

## 2022-03-21 LAB — AMYLASE: Amylase: 33 U/L (ref 21–101)

## 2022-03-21 LAB — LIPASE: Lipase: 9 U/L (ref 7–60)

## 2022-03-21 MED ORDER — PROMETHAZINE HCL 25 MG/ML IJ SOLN
25.0000 mg | Freq: Four times a day (QID) | INTRAMUSCULAR | Status: AC | PRN
  Administered 2022-03-21: 25 mg via INTRAVENOUS

## 2022-03-21 MED ORDER — KETOROLAC TROMETHAMINE 30 MG/ML IJ SOLN
30.0000 mg | Freq: Once | INTRAMUSCULAR | Status: AC
  Administered 2022-03-21: 30 mg via INTRAVENOUS

## 2022-03-21 MED ORDER — OXYCODONE-ACETAMINOPHEN 10-325 MG PO TABS: 1.0000 | ORAL_TABLET | ORAL | 0 refills | Status: DC | PRN

## 2022-03-21 MED ORDER — METOCLOPRAMIDE HCL 10 MG PO TABS: ORAL_TABLET | ORAL | 3 refills | Status: DC

## 2022-03-21 MED ORDER — PROCHLORPERAZINE MALEATE 10 MG PO TABS: 10.0000 mg | ORAL_TABLET | Freq: Four times a day (QID) | ORAL | 3 refills | Status: DC | PRN

## 2022-03-21 NOTE — Progress Notes (Signed)
    Procedures performed today:    20-gauge angiocatheter placed in the right cubital vein, 2 L lactated Ringer's was infused along with a slow IV push of 30 mg of Toradol and 25 mg of promethazine.  Independent interpretation of notes and tests performed by another provider:   None.  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 No abdominal pain. Unfortunately had multiple episodes of vomiting and unable to keep food, fluids, or medications down. He has tried some Zofran, Phenergan without much improvement. Currently on Wegovy which certainly can exacerbate nausea vomiting. He was feeling dizzy, lightheaded, weak, for this reason we felt parenteral intervention was necessary. Today due to nausea and vomiting multiple times we ran 2 L of lactated Ringer's through her right cubital vein. I also did a slow 1 minute IV push of 25 mg of Phenergan and 30 mg of Toradol as he was having some back pain as well. He will discontinue [facility] for now. (Last shot was a couple days ago) We will get labs including CBC, CMP, amylase, lipase, specifically I would like to see his potassium considering persistent emesis and diarrhea. We will also send in Reglan and Compazine, he has plenty of Zofran at home. Blood pressure and pulse improved considerably after 2 L LR. Keep close follow-up.   Lumbar degenerative disc disease 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 We proceeded with a right L4-L5 interlaminar epidural without significant improvement. On his lumbar spine MRI he did have what appeared to be a lower thoracic disc protrusion with significant central canal stenosis so we will proceed with a thoracic  spine MRI. He is also having significant hip and pelvic pain so we will also get a right hip MRI. Tramadol ineffective so we will increase to full dose oxycodone. With right-sided radicular discomfort we will also get nerve conduction and EMG. We will keep neurosurgery referral on the back burner for now.  I spent 40 minutes of total time managing this patient today, this includes chart review, face to face, and non-face to face time.  ___________________________________________ Ihor Austin. Benjamin Stain, M.D., ABFM., CAQSM. Primary Care and Sports Medicine [facility]  Adjunct Instructor of Family Medicine  [employer]

## 2022-03-21 NOTE — Assessment & Plan Note (Addendum)
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 No abdominal pain. Unfortunately had multiple episodes of vomiting and unable to keep food, fluids, or medications down. He has tried some Zofran, Phenergan without much improvement. Currently on Wegovy which certainly can exacerbate nausea vomiting. He was feeling dizzy, lightheaded, weak, for this reason we felt parenteral intervention was necessary. Today due to nausea and vomiting multiple times we ran 2 L of lactated Ringer's through her right cubital vein. I also did a slow 1 minute IV push of 25 mg of Phenergan and 30 mg of Toradol as he was having some back pain as well. He will discontinue [facility] for now. (Last shot was a couple days ago) We will get labs including CBC, CMP, amylase, lipase, specifically I would like to see his potassium considering persistent emesis and diarrhea. We will also send in Reglan and Compazine, he has plenty of Zofran at home. Blood pressure and pulse improved considerably after 2 L LR. Keep close follow-up.

## 2022-03-21 NOTE — Assessment & Plan Note (Signed)
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 We proceeded with a right L4-L5 interlaminar epidural without significant improvement. On his lumbar spine MRI he did have what appeared to be a lower thoracic disc protrusion with significant central canal stenosis so we will proceed with a thoracic spine MRI. He is also having significant hip and pelvic pain so we will also get a right hip MRI. Tramadol ineffective so we will increase to full dose oxycodone. With right-sided radicular discomfort we will also get nerve conduction and EMG. We will keep neurosurgery referral on the back burner for now.

## 2022-03-25 ENCOUNTER — Ambulatory Visit (INDEPENDENT_AMBULATORY_CARE_PROVIDER_SITE_OTHER): Payer: 59

## 2022-03-25 DIAGNOSIS — G8929 Other chronic pain: Secondary | ICD-10-CM | POA: Diagnosis not present

## 2022-03-25 DIAGNOSIS — M5136 Other intervertebral disc degeneration, lumbar region: Secondary | ICD-10-CM

## 2022-03-25 DIAGNOSIS — M25551 Pain in right hip: Secondary | ICD-10-CM | POA: Diagnosis not present

## 2022-03-25 DIAGNOSIS — M549 Dorsalgia, unspecified: Secondary | ICD-10-CM | POA: Diagnosis not present

## 2022-03-25 DIAGNOSIS — M51369 Other intervertebral disc degeneration, lumbar region without mention of lumbar back pain or lower extremity pain: Secondary | ICD-10-CM

## 2022-03-25 IMAGING — MR MR THORACIC SPINE W/O CM
5 series · 42 of 48 positions shown · non-contrast
Comparison: Lumbar spine MRI [DATE]

CLINICAL DATA: Mid and low back pain.

EXAM:
MRI THORACIC SPINE WITHOUT CONTRAST
TECHNIQUE: Multiplanar, multisequence MR imaging of the thoracic spine was
performed. No intravenous contrast was administered.

[Series 8: T2 · sagittal · 3.0mm · 1.06mm/px · 6 of 15 slices shown (1 of 2)]
[im 1/15]
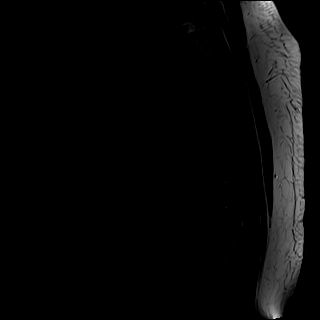
[im 3/15]
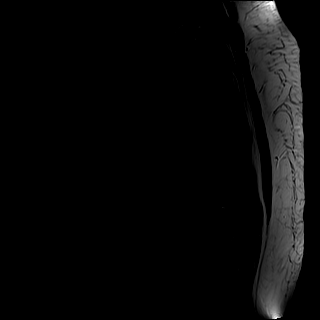
[im 6/15]
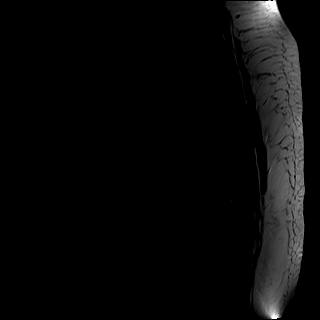
[im 9/15]
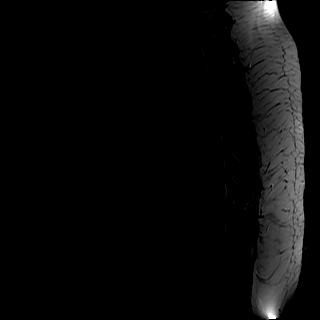
[im 12/15]
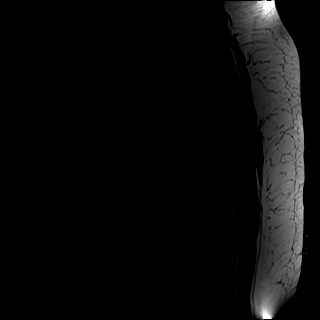
[im 15/15]
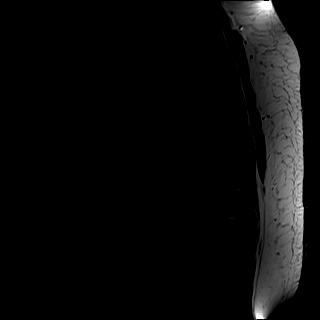

[Series 9: T1 · sagittal · 3.0mm · 1.06mm/px · 6 of 15 slices shown]
[im 1/15]
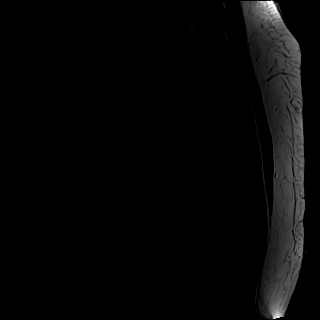
[im 3/15]
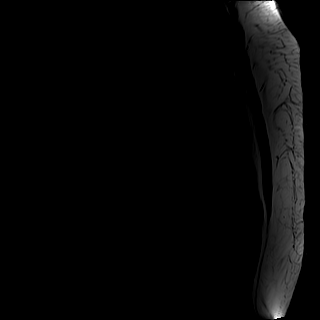
[im 6/15]
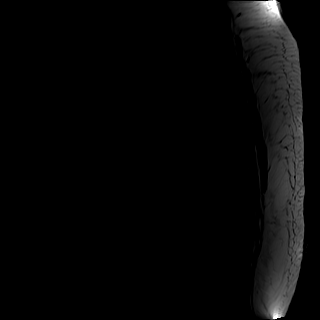
[im 9/15]
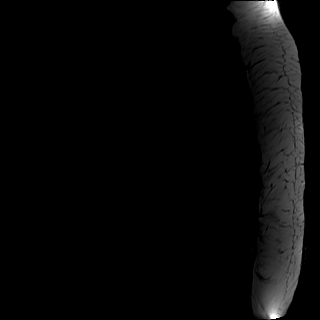
[im 12/15]
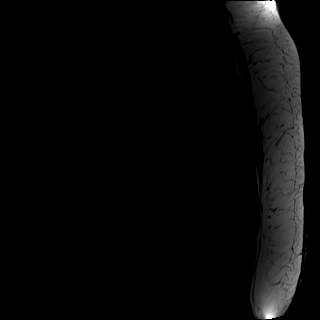
[im 15/15]
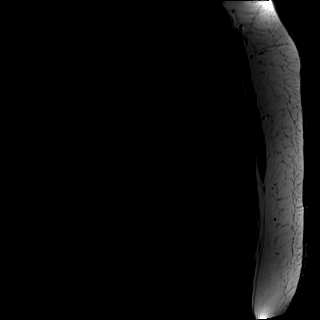

[Series 10: STIR · sagittal · 3.0mm · 1.06mm/px · 6 of 15 slices shown]
[im 1/15]
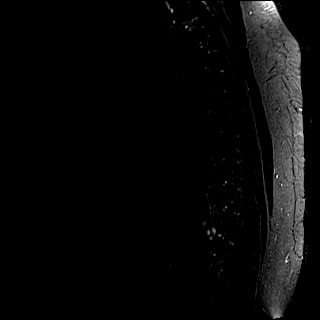
[im 3/15]
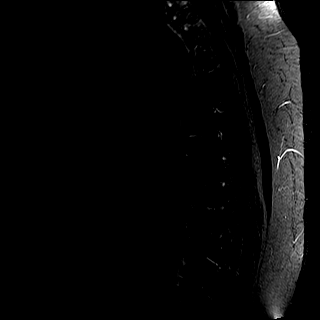
[im 6/15]
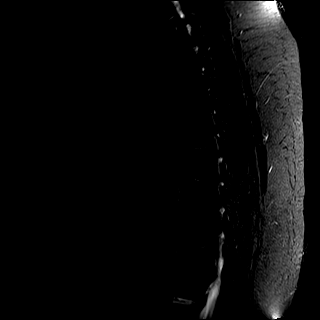
[im 9/15]
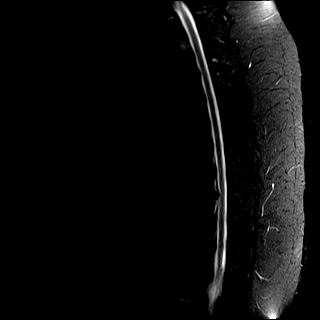
[im 12/15]
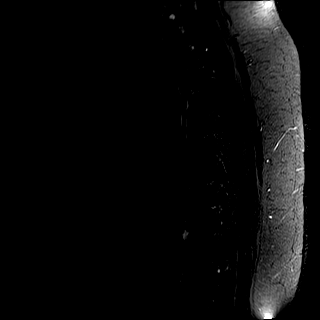
[im 15/15]
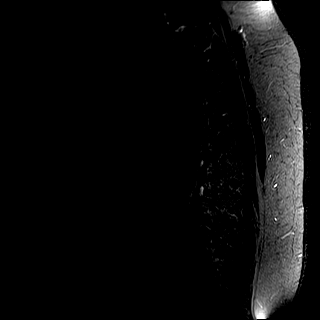

[Series 11: T2 · axial · 5.0mm · 0.78mm/px · z∈[-384,-127]mm · 15 of 40 slices shown (2 of 2)]
[im 1/40]
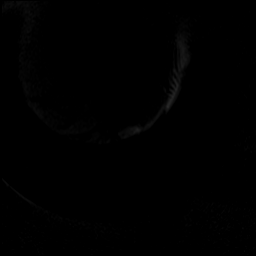
[im 3/40]
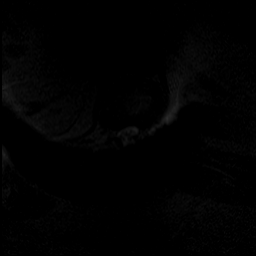
[im 6/40]
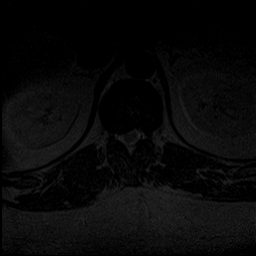
[im 9/40]
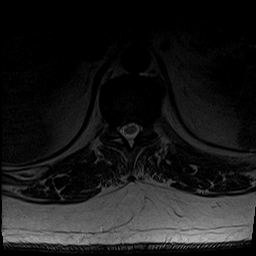
[im 12/40]
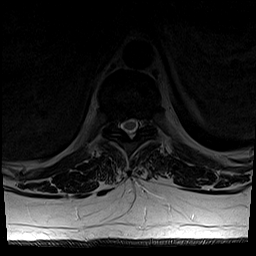
[im 14/40]
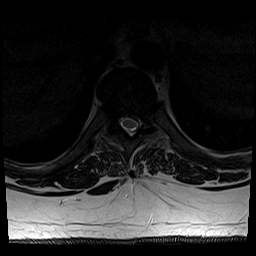
[im 17/40]
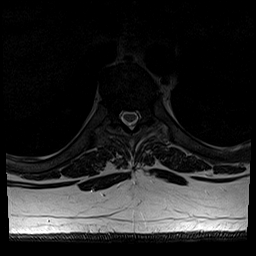
[im 20/40]
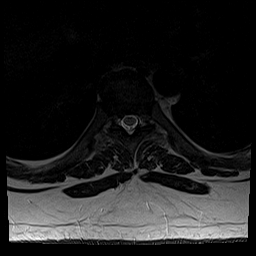
[im 23/40]
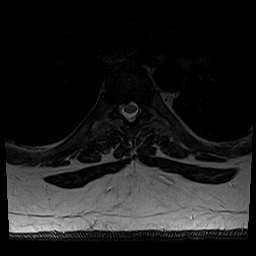
[im 26/40]
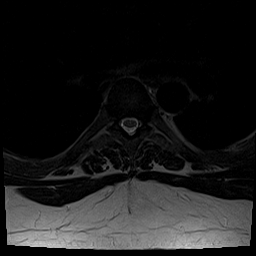
[im 28/40]
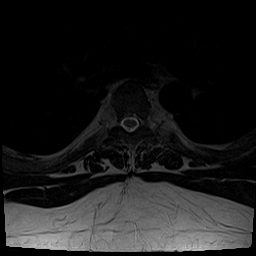
[im 31/40]
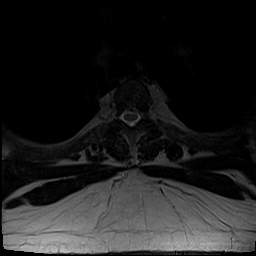
[im 34/40]
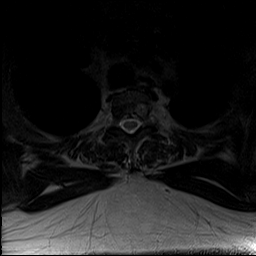
[im 37/40]
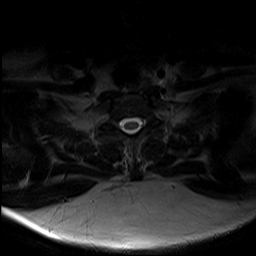
[im 40/40]
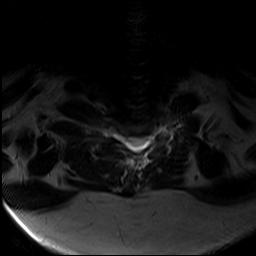

[Series 12: ax mpgr · axial · 5.0mm · 0.39mm/px · z∈[-389,-118]mm · 9 of 40 slices shown]
[im 1/40]
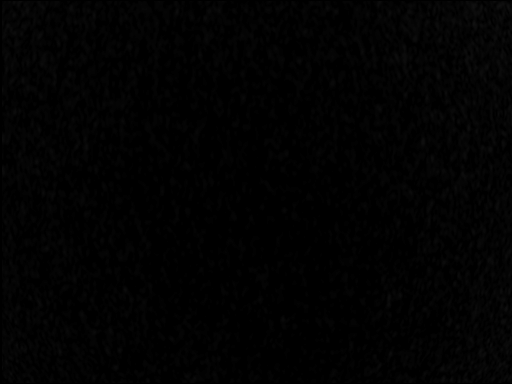
[im 3/40]
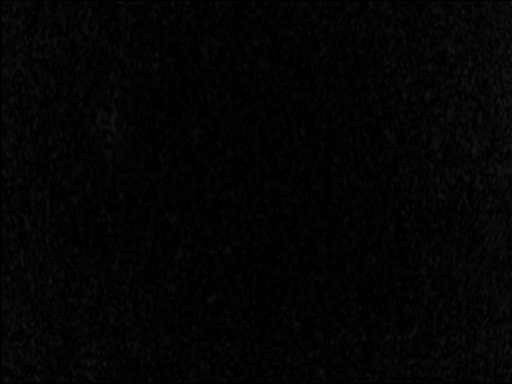
[im 6/40]
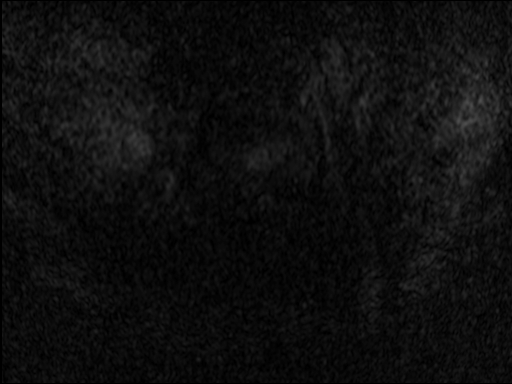
[im 12/40]
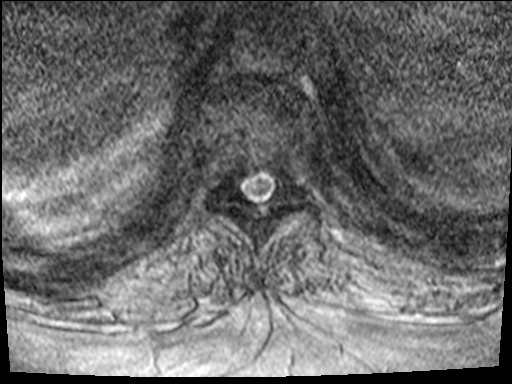
[im 17/40]
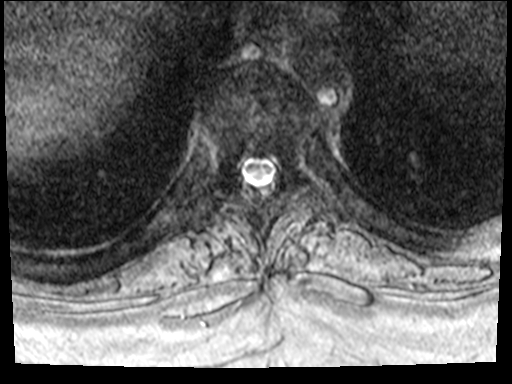
[im 23/40]
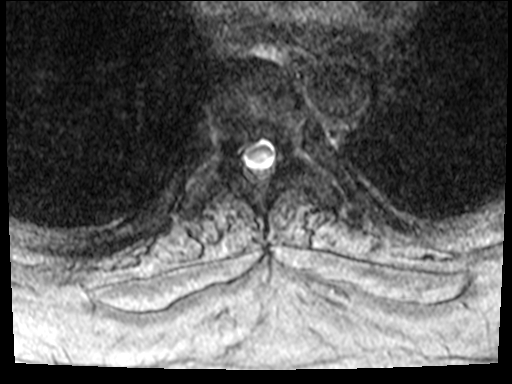
[im 28/40]
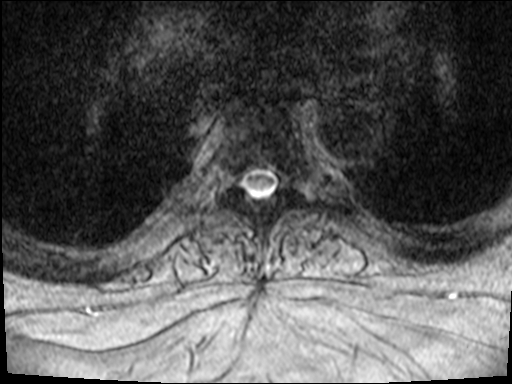
[im 34/40]
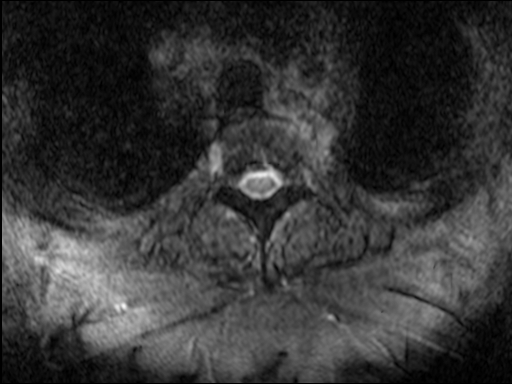
[im 40/40]
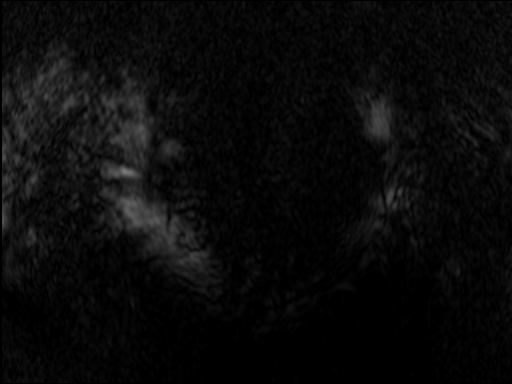

[42 of 48 positions shown; findings below may reference images not displayed]

FINDINGS: Alignment:  Normal.

Vertebrae: Hemangiomas in the T2 and T8 vertebral bodies. Mild
chronic anterior wedging of the T11 and T12 vertebral bodies.
Scattered small Schmorl's nodes.

Cord:  Normal signal.

Paraspinal and other soft tissues: Unremarkable.

Disc levels:

T1-2: Small right central disc protrusion without stenosis.

T2-3: Negative.

T3-4: Small left central disc protrusion without stenosis.

T4-5: Negative.

T5-6: Small right paracentral disc protrusion without stenosis.

T6-7: Small right paracentral disc protrusion without stenosis.

T7-8: Small central disc protrusion without stenosis.

T8-9: Small left paracentral disc protrusion mildly flattens the
left ventral spinal cord without significant stenosis.

T9-10: Small right paracentral disc protrusion and mild facet
arthrosis without stenosis.

T10-11: Small right paracentral disc protrusion and mild facet
arthrosis without stenosis.

T11-12: Moderate-sized right paracentral disc protrusion results in
borderline spinal stenosis. Patent neural foramina.

T12-L1: Negative.
IMPRESSION: Numerous thoracic disc protrusions, largest on the right at T11-12
where there is borderline spinal stenosis.

## 2022-03-25 IMAGING — MR MR HIP*R* W/O CM
5 series · 30 of 40 positions shown · non-contrast
Comparison: [DATE]

CLINICAL DATA: Right hip pain. Radiates to the lower leg for 2 3
months. No known injury.

EXAM:
MR OF THE RIGHT HIP WITHOUT CONTRAST
TECHNIQUE: Multiplanar, multisequence MR imaging was performed. No intravenous
contrast was administered.

[Series 3: T1 · coronal · 4.0mm · 0.85mm/px · 8 of 36 slices shown]
[im 1/36]
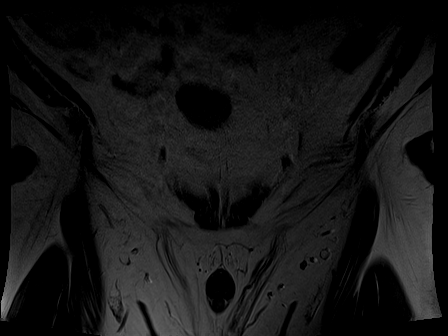
[im 4/36]
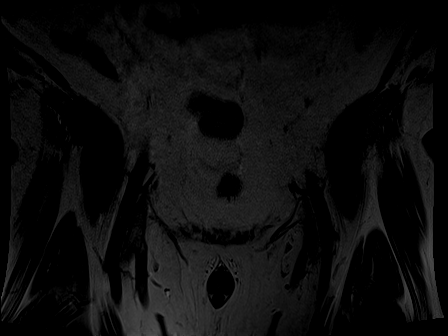
[im 12/36]
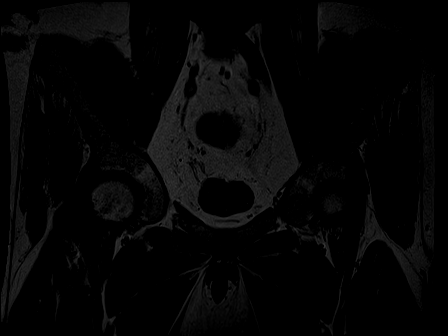
[im 16/36]
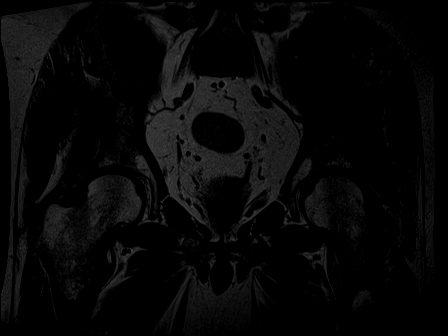
[im 20/36]
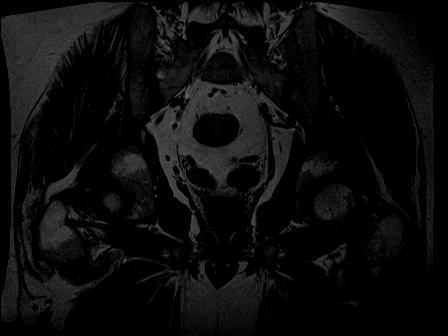
[im 24/36]
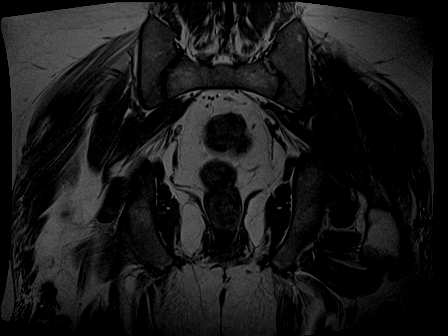
[im 32/36]
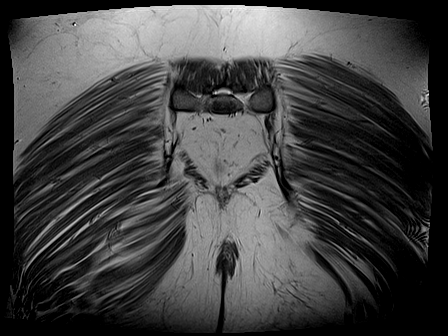
[im 36/36]
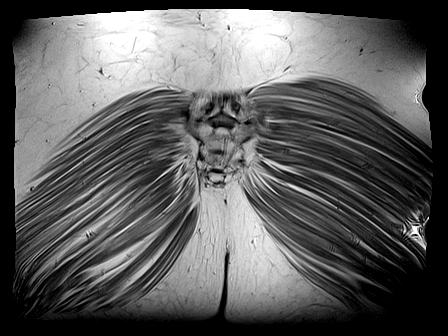

[Series 4: STIR · axial · 4.0mm · 1.48mm/px · z∈[-81,+64]mm · 8 of 30 slices shown (1 of 2)]
[im 1/30]
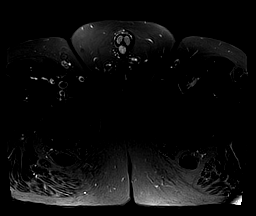
[im 5/30]
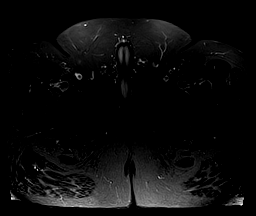
[im 9/30]
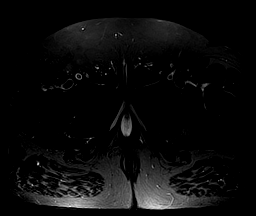
[im 13/30]
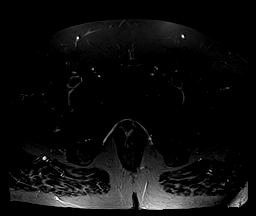
[im 17/30]
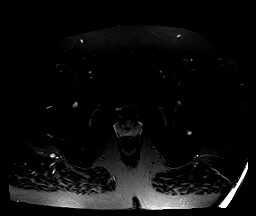
[im 21/30]
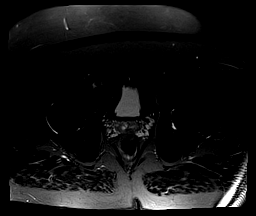
[im 25/30]
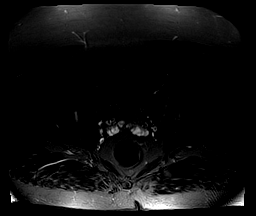
[im 30/30]
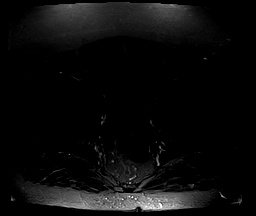

[Series 5: STIR · coronal · 4.0mm · 1.19mm/px · 2 of 36 slices shown (2 of 2)]
[im 1/36]
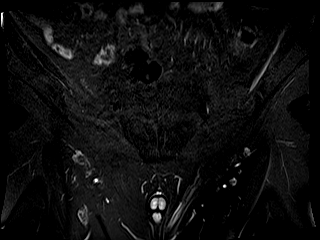
[im 4/36]
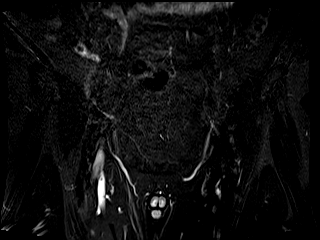

[Series 6: PD fat-sat · sagittal · 4.5mm · 0.41mm/px · 6 of 24 slices shown (1 of 2)]
[im 1/24]
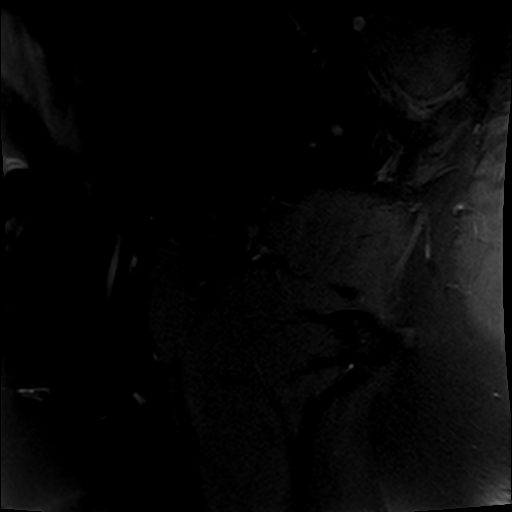
[im 5/24]
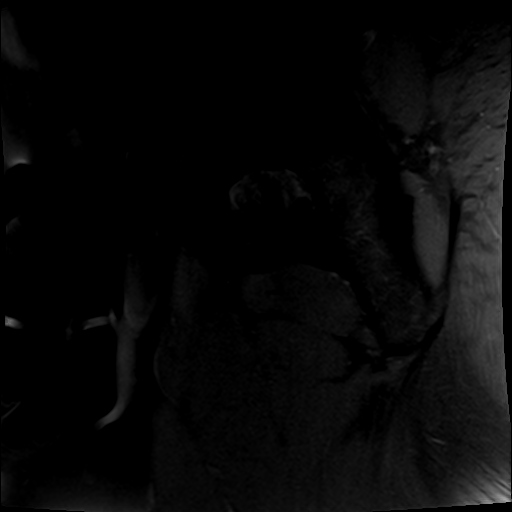
[im 10/24]
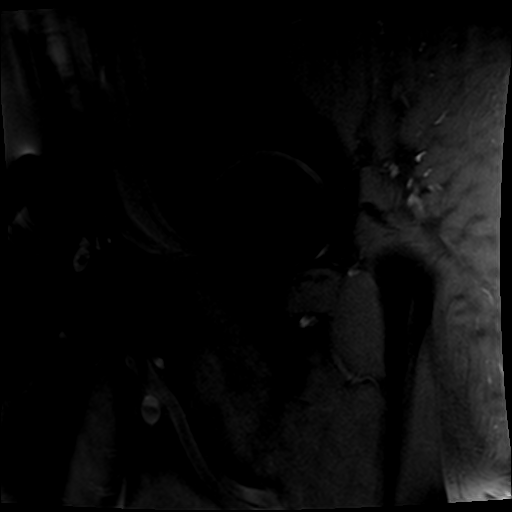
[im 14/24]
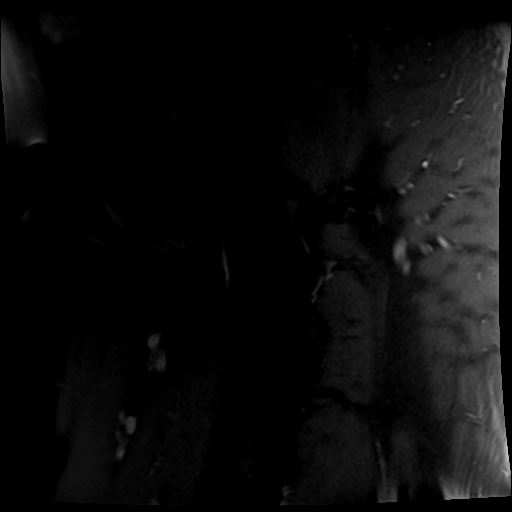
[im 19/24]
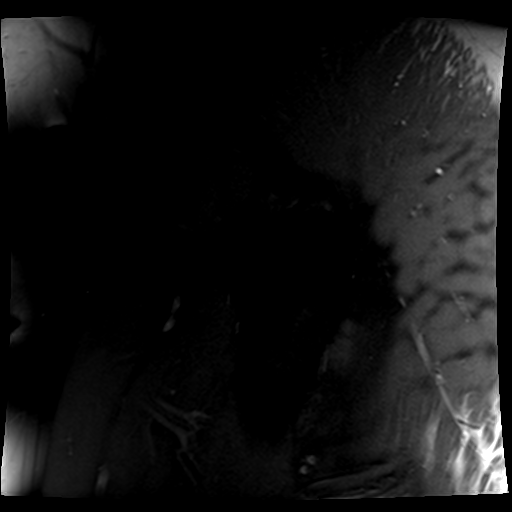
[im 24/24]
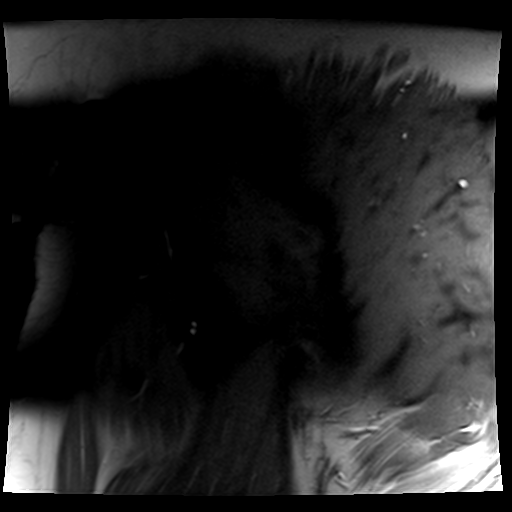

[Series 7: PD fat-sat · coronal · 4.5mm · 0.39mm/px · 6 of 23 slices shown (2 of 2)]
[im 1/23]
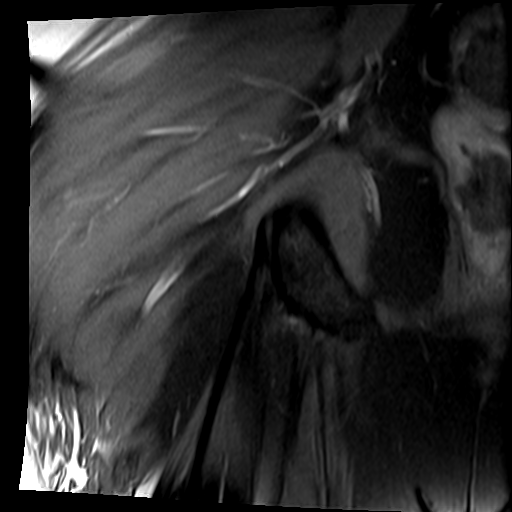
[im 5/23]
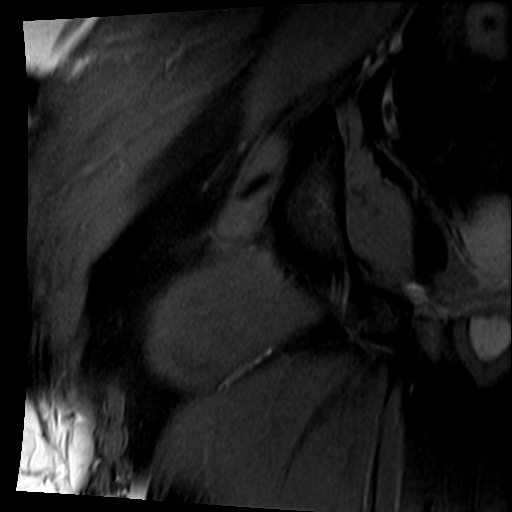
[im 9/23]
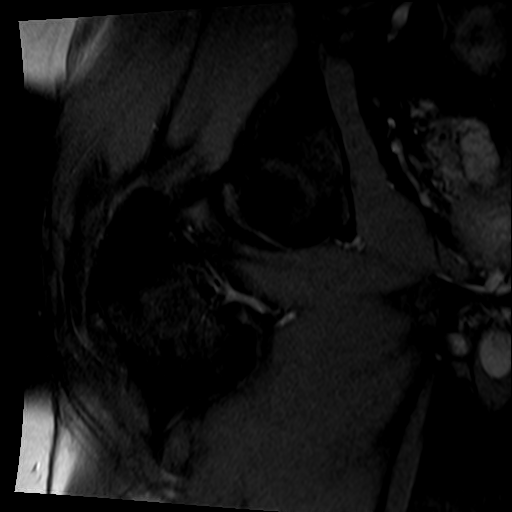
[im 14/23]
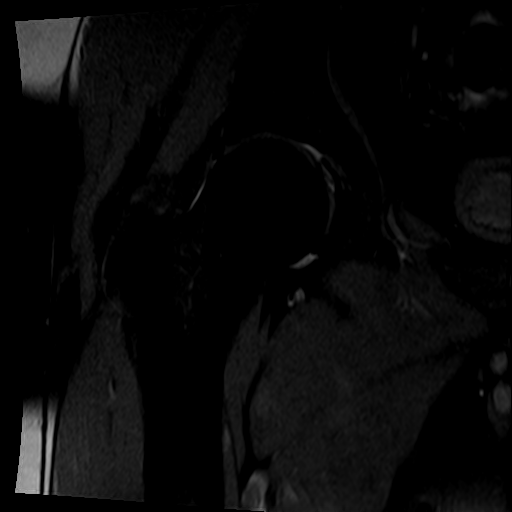
[im 18/23]
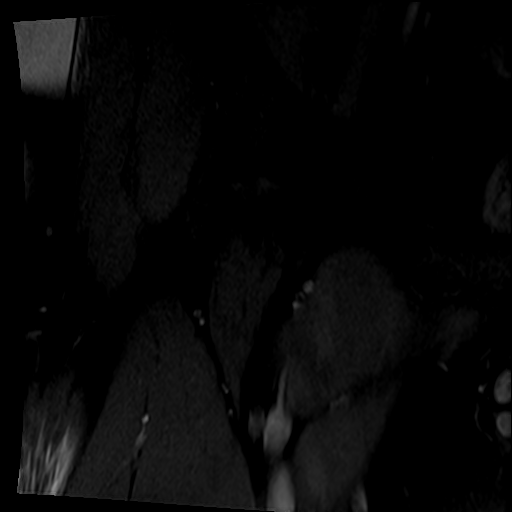
[im 23/23]
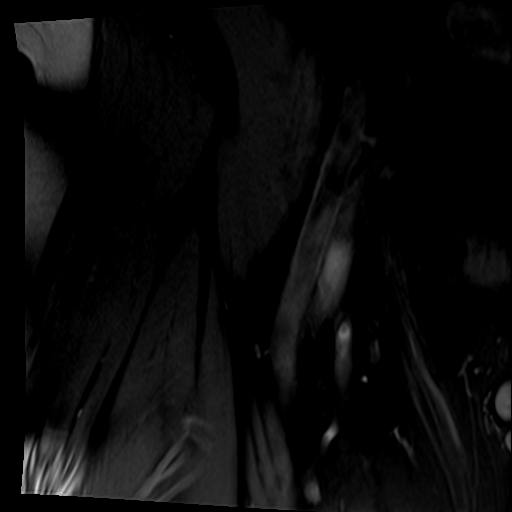

[30 of 40 positions shown; findings below may reference images not displayed]

FINDINGS: Bones:

No hip fracture, dislocation or avascular necrosis.

No periosteal reaction or bone destruction. No aggressive osseous
lesion.

Normal sacrum and sacroiliac joints. No SI joint widening or erosive
changes.

Mild disc desiccation at L4-5 and L5-S1.

Articular cartilage and labrum

Articular cartilage:  No chondral defect.

Labrum: Evaluation of the labrum is limited secondary to lack of
intra-articular fluid and patient motion. No discrete labral tear.

Joint or bursal effusion

Joint effusion:  No hip joint effusion.  No SI joint effusion.

Bursae:  No bursa formation.

Muscles and tendons

Flexors: Normal.

Extensors: Normal.

Abductors: Normal.

Adductors: Normal.

Gluteals: Normal.

Hamstrings: Normal.

Other findings

No pelvic free fluid. No fluid collection or hematoma. No inguinal
lymphadenopathy. No inguinal hernia.
IMPRESSION: 1. No hip fracture, dislocation or avascular necrosis.
2. Evaluation of the labrum is limited secondary to lack of
intra-articular fluid and patient motion. No discrete labral tear.
If there is further clinical concern, recommend an MR arthrogram of
the right hip.

## 2022-03-26 ENCOUNTER — Ambulatory Visit (INDEPENDENT_AMBULATORY_CARE_PROVIDER_SITE_OTHER): Payer: 59

## 2022-03-26 ENCOUNTER — Ambulatory Visit (INDEPENDENT_AMBULATORY_CARE_PROVIDER_SITE_OTHER): Payer: 59 | Admitting: Sports Medicine

## 2022-03-26 DIAGNOSIS — M5136 Other intervertebral disc degeneration, lumbar region: Secondary | ICD-10-CM

## 2022-03-26 DIAGNOSIS — M5135 Other intervertebral disc degeneration, thoracolumbar region: Secondary | ICD-10-CM | POA: Diagnosis not present

## 2022-03-26 DIAGNOSIS — M51369 Other intervertebral disc degeneration, lumbar region without mention of lumbar back pain or lower extremity pain: Secondary | ICD-10-CM

## 2022-03-26 NOTE — Progress Notes (Addendum)
    Procedures performed today:    Procedure: Real-time Ultrasound Guided injection of the right sacroiliac joint Device: Samsung [id number]  Verbal informed consent obtained.  Time-out conducted.  Noted no overlying erythema, induration, or other signs of local infection.  Skin prepped in a sterile fashion.  Local anesthesia: Topical Ethyl chloride.  With sterile technique and under real time ultrasound guidance: Noted normal-appearing joint, 1 cc Kenalog 40, 2 cc lidocaine, 2 cc bupivacaine injected easily Completed without difficulty  Advised to call if fevers/chills, erythema, induration, drainage, or persistent bleeding.  Images permanently stored and available for review in PACS.  Impression: Technically successful ultrasound guided injection.  Independent interpretation of notes and tests performed by another provider:   None.  Brief History, Exam, Impression, and Recommendations:    Degeneration of thoracolumbar intervertebral disc [name] returns, he has axial discogenic back pain, we have terminale obtained an MRI that showed multilevel disc desiccation, with very mild right L4 and L5 nerve root impingement, we proceeded with a right L4-L5 intralaminar epidural, and unfortunately he did not get significant improvement. On MRI he did have what appeared to be a lower thoracic disc protrusion at T11-T12 with significant right central canal and foraminal stenosis so we proceeded with a T-spine MRI, confirming the same. No myelomalacia visible. Pain has been right low back, today over the SI joint, as well as weakness and pain into the right upper groin. Today we injected his right sacroiliac joint for diagnostic and therapeutic purposes, he did endorse good resolution of his right-sided lower back pain, but continues to have right-sided upper groin pain with some weakness. I am concerned that some of his symptomatology is coming from the right T11-T12 disc protrusion so we will  proceed with an epidural on the right at T11-T12, I would also like to get a surgical opinion from Dr. Yetta Barre.    ___________________________________________ [name]. Benjamin Stain, M.D., ABFM., CAQSM. Primary Care and Sports Medicine [facility]  Adjunct Instructor of Family Medicine  [employer]

## 2022-03-26 NOTE — Assessment & Plan Note (Signed)
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 On MRI he did have what appeared to be a lower thoracic disc protrusion at T11-T12 with significant right central canal and foraminal stenosis so we proceeded with a T-spine MRI, confirming the same. No myelomalacia visible. Pain has been right low back, today over the SI joint, as well as weakness and pain into the right upper groin. Today we injected his right sacroiliac joint for diagnostic and therapeutic purposes, he did endorse good resolution of his right-sided lower back pain, but continues to have right-sided upper groin pain with some weakness. I am concerned that some of his symptomatology is coming from the right T11-T12 disc protrusion so we will proceed with an epidural on the right at T11-T12, I would also like to get a surgical opinion from Dr. Ronnald Ramp.

## 2022-03-26 NOTE — Addendum Note (Signed)
Addended by: Monica Becton on: 03/26/2022 04:46 PM   Modules accepted: Orders

## 2022-04-09 ENCOUNTER — Inpatient Hospital Stay: Admission: RE | Admit: 2022-04-09 | Payer: 59 | Source: Ambulatory Visit

## 2022-04-10 ENCOUNTER — Ambulatory Visit
Admission: RE | Admit: 2022-04-10 | Discharge: 2022-04-10 | Disposition: A | Discharge status: Home / Self Care | Payer: 59 | Source: Ambulatory Visit | Attending: Sports Medicine | Admitting: Sports Medicine

## 2022-04-10 ENCOUNTER — Other Ambulatory Visit: Payer: Self-pay | Admitting: Sports Medicine

## 2022-04-10 DIAGNOSIS — M5136 Other intervertebral disc degeneration, lumbar region: Secondary | ICD-10-CM

## 2022-04-10 DIAGNOSIS — M51369 Other intervertebral disc degeneration, lumbar region without mention of lumbar back pain or lower extremity pain: Secondary | ICD-10-CM

## 2022-04-10 DIAGNOSIS — M47814 Spondylosis without myelopathy or radiculopathy, thoracic region: Secondary | ICD-10-CM | POA: Diagnosis not present

## 2022-04-10 IMAGING — XA DG INJECT/[PERSON_NAME] INC NEEDLE/CATH/PLC EPI/CERV/THOR W/IMG
2 series · 2 of 2 positions shown · non-contrast
Comparison: none

CLINICAL DATA: Thoracic spondylosis without myelopathy. Right-sided
low back and groin pain. No significant improvement following the
epidural injection at L4-5 last month. Right-sided T11-12 epidural
injection requested today.

[Series 1: ortho adipose · 1 of 1 slices shown (1 of 2)]
[im 1/1]
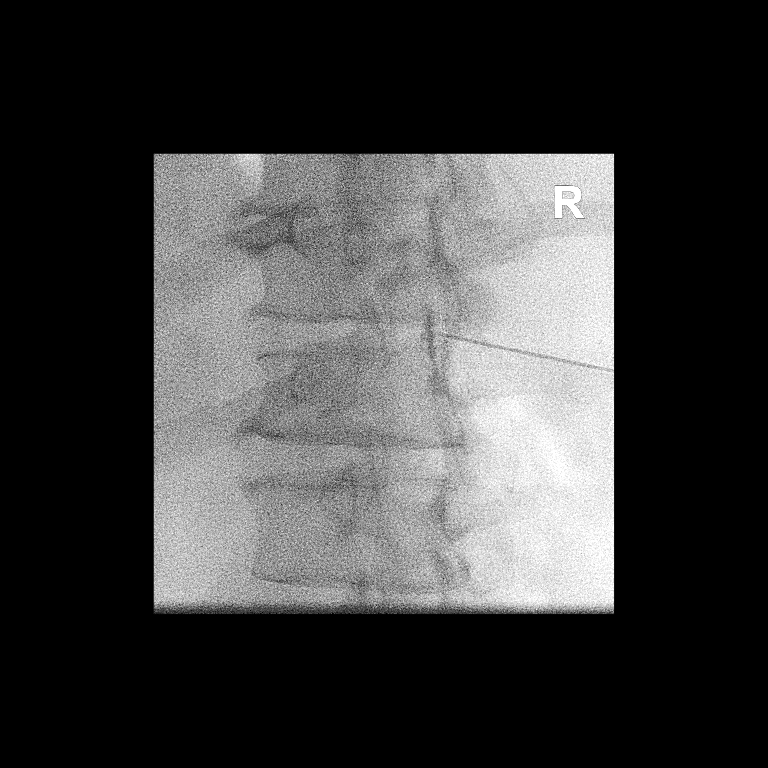

[Series 2: ortho adipose · 1 of 1 slices shown (2 of 2)]
[im 1/1]
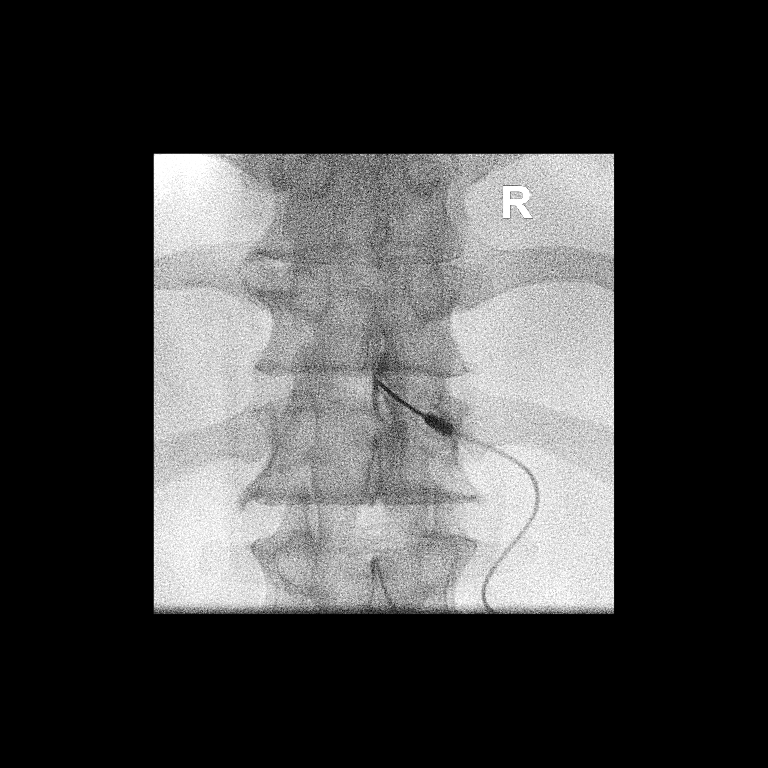

[2 of 2 positions shown; findings below may reference images not displayed]

FLUOROSCOPY:
Radiation Exposure Index (as provided by the fluoroscopic device):
13.10 mGy Kerma

PROCEDURE:
The procedure, risks, benefits, and alternatives were explained to
the patient. Questions regarding the procedure were encouraged and
answered. The patient understands and consents to the procedure.

THORACIC EPIDURAL INJECTION

An interlaminar approach was performed on the right at T11-12. A
inch 20 gauge epidural needle was advanced using loss-of-resistance
technique.

DIAGNOSTIC EPIDURAL INJECTION

Injection of Isovue-M 300 shows a good epidural pattern with spread
above and below the level of needle placement, primarily on the
right. No vascular opacification is seen.

THERAPEUTIC EPIDURAL INJECTION

1.5 mL of Kenalog 40 mixed with 2 mL of 1% Lidocaine were then
instilled. The procedure was well-tolerated, and the patient was
discharged thirty minutes following the injection in good condition.
IMPRESSION: Technically successful interlaminar epidural injection on the right
at T11-12.

## 2022-04-10 MED ORDER — TRIAMCINOLONE ACETONIDE 40 MG/ML IJ SUSP (RADIOLOGY)
60.0000 mg | Freq: Once | INTRAMUSCULAR | Status: AC
  Administered 2022-04-10: 60 mg via EPIDURAL

## 2022-04-10 MED ORDER — IOPAMIDOL (ISOVUE-M 300) INJECTION 61%
1.0000 mL | Freq: Once | INTRAMUSCULAR | Status: AC
  Administered 2022-04-10: 1 mL via EPIDURAL

## 2022-04-10 MED ORDER — OXYCODONE-ACETAMINOPHEN 10-325 MG PO TABS: 1.0000 | ORAL_TABLET | ORAL | 0 refills | Status: DC | PRN

## 2022-04-10 NOTE — Discharge Instructions (Signed)

## 2022-04-11 ENCOUNTER — Other Ambulatory Visit: Payer: Self-pay | Admitting: Sports Medicine

## 2022-04-11 ENCOUNTER — Other Ambulatory Visit (HOSPITAL_BASED_OUTPATIENT_CLINIC_OR_DEPARTMENT_OTHER): Payer: Self-pay

## 2022-04-11 DIAGNOSIS — M51369 Other intervertebral disc degeneration, lumbar region without mention of lumbar back pain or lower extremity pain: Secondary | ICD-10-CM

## 2022-04-11 DIAGNOSIS — M5136 Other intervertebral disc degeneration, lumbar region: Secondary | ICD-10-CM

## 2022-04-11 MED ORDER — OXYCODONE-ACETAMINOPHEN 10-325 MG PO TABS
1.0000 | ORAL_TABLET | ORAL | 0 refills | Status: DC | PRN
  Filled 2022-04-11: qty 30, 5d supply, fill #0

## 2022-04-17 ENCOUNTER — Other Ambulatory Visit: Payer: Self-pay | Admitting: Sports Medicine

## 2022-04-17 DIAGNOSIS — M5417 Radiculopathy, lumbosacral region: Secondary | ICD-10-CM | POA: Diagnosis not present

## 2022-04-17 DIAGNOSIS — M5416 Radiculopathy, lumbar region: Secondary | ICD-10-CM | POA: Diagnosis not present

## 2022-04-17 DIAGNOSIS — M5135 Other intervertebral disc degeneration, thoracolumbar region: Secondary | ICD-10-CM

## 2022-04-18 ENCOUNTER — Ambulatory Visit: Payer: 59 | Admitting: Neurology

## 2022-04-18 VITALS — BP 148/96 | HR 84 | Ht 72.0 in | Wt 351.0 lb

## 2022-04-18 DIAGNOSIS — M5416 Radiculopathy, lumbar region: Secondary | ICD-10-CM

## 2022-04-18 NOTE — Progress Notes (Signed)
EMG/NCS report is under procedure 

## 2022-04-18 NOTE — Procedures (Unsigned)
Full Name: Austin [name] Gender: Male MRN #: [id number] Date of Birth: 1986-03-21    Visit Date: 04/18/2022 07:38 Age: 36 Years Examining Physician: Levert Feinstein Referring Physician: Levert Feinstein Height: 6 feet 0 inch History: 36 year old male complains of 3 months history of right low back pain radiating pain to right lower extremity  On examination: Mild right toe extension weakness, absent right patellar reflex  Summary of the test:  Nerve conduction study:  Right sural, superficial sensory responses were normal.  Right peroneal to EDB tibial motor responses were normal  Electromyography: Selected needle examination of right lower extremity muscles and right lumbosacral paraspinal muscles was performed. The only abnormality is mildly increased insertional activity of right tibialis anterior, normal motor unit potential mixed with mildly enlarged complex motor unit potential with slight decreased recruitment patterns.  There was no spontaneous activity at the right lumbosacral paraspinal muscles.    Conclusion: This is a mildly abnormal study, there is evidence of mild chronic right L4 radiculopathy.    ------------------------------- Levert Feinstein, M.D. PhD  [facility] [address], [address] [place], [place] [address] Tel: [phone] Fax: [phone]  Verbal informed consent was obtained from the patient, patient was informed of potential risk of procedure, including bruising, bleeding, hematoma formation, infection, muscle weakness, muscle pain, numbness, among others.        MNC    Nerve / Sites Muscle Latency Ref. Amplitude Ref. Rel Amp Segments Distance Velocity Ref. Area    ms ms mV mV %  cm m/s m/s mVms  R Peroneal - EDB     Ankle EDB 5.2 ?6.5 5.9 ?2.0 100 Ankle - EDB 9   22.5     Fib head EDB 12.0  5.0  84.4 Fib head - Ankle 32 47 ?44 21.2     Pop fossa EDB 14.6  4.8  95.9 Pop fossa - Fib head 10 40 ?44 21.7         Pop fossa - Ankle       R Tibial - AH     Ankle AH 4.0 ?5.8 18.2 ?4.0 100 Ankle - AH 9   36.9     Pop fossa AH 15.8  11.9  65.4 Pop fossa - Ankle 49 41 ?41 31.4         SNC    Nerve / Sites Rec. Site Peak Lat Ref.  Amp Ref. Segments Distance    ms ms V V  cm  R Sural - Ankle (Calf)     Calf Ankle 4.2 ?4.4 8 ?6 Calf - Ankle 14  R Superficial peroneal - Ankle     Lat leg Ankle 3.4 ?4.4 6 ?6 Lat leg - Ankle 14         F  Wave    Nerve F Lat Ref.   ms ms  R Tibial - AH 56.8 ?56.0       EMG Summary Table    Spontaneous MUAP Recruitment  Muscle IA Fib PSW Fasc Other Amp Dur. Poly Pattern  R. Tibialis posterior Normal None None None _______ Normal Normal Normal Normal  R. Tibialis anterior Increased 1+ None None _______ Normal Increased 1+ Reduced  R. Gastrocnemius (Medial head) Normal None None None _______ Normal Normal Normal Normal  R. Peroneus longus Normal None None None _______ Normal Normal Normal Normal  R. Vastus lateralis Normal None None None _______ Normal Normal Normal Normal  R. Lumbar paraspinals (low) Normal None None None  _______ Normal Normal Normal Normal  R. Lumbar paraspinals (mid) Normal None None None _______ Normal Normal Normal Normal  L. Tibialis anterior Normal None None None _______ Normal Normal Normal Normal

## 2022-04-25 ENCOUNTER — Other Ambulatory Visit: Payer: Self-pay | Admitting: Sports Medicine

## 2022-04-25 ENCOUNTER — Ambulatory Visit: Payer: 59 | Attending: Sports Medicine | Admitting: Rehabilitative and Restorative Service Providers"

## 2022-04-25 DIAGNOSIS — M545 Low back pain, unspecified: Secondary | ICD-10-CM | POA: Insufficient documentation

## 2022-04-25 DIAGNOSIS — R29898 Other symptoms and signs involving the musculoskeletal system: Secondary | ICD-10-CM | POA: Diagnosis not present

## 2022-04-25 DIAGNOSIS — M5135 Other intervertebral disc degeneration, thoracolumbar region: Secondary | ICD-10-CM | POA: Insufficient documentation

## 2022-04-25 MED ORDER — PREDNISONE 10 MG (48) PO TBPK: ORAL_TABLET | Freq: Every day | ORAL | 0 refills | Status: DC

## 2022-04-25 NOTE — Therapy (Addendum)
[facility] [place] [address] [place], [place], [address] Phone: [phone]   Fax:  [phone]  Physical Therapy Evaluation  Patient Details  Name: Austin MINES, MD MRN: [id number] Date of Birth: 1986/08/12 Referring Provider (PT): Rodney Langton, MD   Encounter Date: 04/25/2022   PT End of Session - 04/25/22 1240     Visit Number 1    Number of Visits 16    Date for PT Re-Evaluation 06/24/22    Authorization Type UMR    PT Start Time 1148    PT Stop Time 1233    PT Time Calculation (min) 45 min    Activity Tolerance Patient tolerated treatment well;Patient limited by pain    Behavior During Therapy [facility] for tasks assessed/performed             No past medical history on file.  No past surgical history on file.  There were no vitals filed for this visit.    Subjective Assessment - 04/25/22 1153     Subjective The patient is known from our clinic from last year for low back therapy.  He was doing well until 3 months ago when he lifted a shop vac and twisted.  Pain has worsened and is at R SI radiating into gluts, piriformis, and to the knee. He has experienced 2 episodes of R knee buckling due to weakness.  He has hyporeflexia in patellar tendon.  He tried to return to prior HEP and got R groin pain.    Pertinent History T11- T 12 injection, R SI injection.    Patient Stated Goals reduce pain, improve mobility, strengthen the knee    Currently in Pain? Yes    Pain Score 7     Pain Location Buttocks    Pain Orientation Right    Pain Descriptors / Indicators Aching;Spasm;Discomfort;Sore    Pain Radiating Towards R hip down into knee and anterior shin (L4 region)                [facility] PT Assessment - 04/25/22 1159       Assessment   Medical Diagnosis Lumbar L4 radiculopathy    Referring Provider (PT) Rodney Langton, MD    Onset Date/Surgical Date 04/17/22    Hand Dominance Left    Prior  Therapy known from prior PT      Precautions   Precautions Fall      Restrictions   Weight Bearing Restrictions No      Balance Screen   Has the patient fallen in the past 6 months Yes    How many times? 2- once while on stairs and once after squatting    Has the patient had a decrease in activity level because of a fear of falling?  No    Is the patient reluctant to leave their home because of a fear of falling?  No      Home [employer] information [employer] residence    Living Arrangements Spouse/significant other;Children      Prior Function   Level of Independence Independent    Vocation Full time employment    Vocation Requirements sitting irritates the R hip, standing for prolonged periods gets worse      Observation/Other Assessments   Focus on Therapeutic Outcomes (FOTO)  PT did not capture      Sensation   Light Touch Impaired Detail    Additional Comments tingling down into anterior distal LE  ROM / Strength   AROM / PROM / Strength AROM;Strength      AROM   Overall AROM  Deficits    AROM Assessment Site Lumbar    Lumbar Flexion 25% reduction in motion    Lumbar Extension 25% reduction in motion   worsens tingling into R hip   Lumbar - Right Side Bend 25% reduction in motion   no pain   Lumbar - Left Side Bend 25% reduction in motion   stretching sensation on R   Lumbar - Right Rotation 25 % reduction in motion    Lumbar - Left Rotation 25% reduction in motion      Strength   Overall Strength Deficits    Strength Assessment Site Hip;Knee;Ankle    Right/Left Hip Right;Left    Right Hip Flexion 3+/5   braces to avoid pain   Right Hip Extension 3/5    Right Hip ABduction 4/5    Left Hip Flexion 5/5    Left Hip Extension 4/5    Left Hip ABduction 5/5    Right/Left Knee Right;Left    Right Knee Flexion 5/5    Right Knee Extension 4/5    Left Knee Flexion 5/5    Left Knee Extension 5/5    Right/Left Ankle Right;Left                         Objective measurements completed on examination: See above findings.       [facility] Adult PT Treatment/Exercise - 04/25/22 1218       Exercises   Exercises Lumbar;Knee/Hip      Lumbar Exercises: Stretches   Standing Side Bend Right;Left;1 rep    Standing Side Bend Limitations painful with R QL stretch-- radiates into leg    Press Ups 2 reps    Press Ups Limitations was doing press up with one leg off edge of mat, however, the patient gets increased groin pain-- hold for now    Quad Stretch Right;Left;2 reps;30 seconds    Quad Stretch Limitations prone-- good length    Piriformis Stretch Right;1 rep;30 seconds    Piriformis Stretch Limitations already doing regularly    Other Lumbar Stretch Exercise SI joint mobility standing with one LE on step and rocking ant/posterior      Lumbar Exercises: Standing   Wall Slides 10 reps    Wall Slides Limitations tried with 10 second hold, but noted weakness * did not hold for home program    Other Standing Lumbar Exercises isometric hip extension into wall      Manual Therapy   Manual Therapy Joint mobilization    Joint Mobilization R SI joint grade II-III PA                     PT Education - 04/25/22 1240     Education Details HEP    Person(s) Educated Patient    Methods Explanation;Demonstration;Handout    Comprehension Verbalized understanding;Returned demonstration              PT Short Term Goals - 04/25/22 1251       PT SHORT TERM GOAL #1   Title The patient will be indep with HEP for hip stability, core stability, flexibility, and self mobilization.    Time 4    Period Weeks    Target Date 05/25/22      PT SHORT TERM GOAL #2   Title The patient will report no pain at  rest in sitting.    Time 4    Period Weeks    Target Date 05/25/22      PT SHORT TERM GOAL #3   Title The patient will report reduced incidence of tingling into anterior distal LE.    Time 4    Period Weeks     Target Date 05/25/22               PT Long Term Goals - 04/25/22 1253       PT LONG TERM GOAL #1   Title The patient will be indep with progression of HEP.    Time 8    Period Weeks    Target Date 06/24/22      PT LONG TERM GOAL #2   Title The patient will improve R hip flexor, hip abductor, and knee extensor strength to 5/5.    Time 8    Period Weeks    Target Date 06/24/22      PT LONG TERM GOAL #3   Title The patient will demo full squat with return to standing without c/o R leg weakness/instability.    Time 8    Period Weeks    Target Date 06/24/22      PT LONG TERM GOAL #4   Title The patient will report pain < or equal to 2/10.    Time 8    Period Weeks    Target Date 06/24/22                    Plan - 04/25/22 1257     Clinical Impression Statement The patient is a 36 yo male known to our clinic from prior treatment for low back pain.  He presents today with 3 month worsening of symptoms.  Impairments now include:  hypomobility low thoracic and lumbar for PA mobs, hypomobility SI joint, trigger point R gluts and piriformis, R hip flexor weakness, R knee extensor weakness, R hip abductor weakness, and pain limiting mobility.  In addition the patient reports 2 episodes of R knee buckling with eccentric loading of the R leg.  PT to address deficits to improve strength and reduce pain.    Personal Factors and Comorbidities Comorbidity 2    Comorbidities L4 radiculopathy, Thoracic disc protrusion    Stability/Clinical Decision Making Stable/Uncomplicated    Clinical Decision Making Low    Rehab Potential Good    PT Frequency 2x / week    PT Duration 8 weeks    PT Treatment/Interventions ADLs/Self Care Home Management;Manual techniques;Patient/family education;Moist Heat;Electrical Stimulation;Dry needling;Therapeutic exercise;Therapeutic activities;Neuromuscular re-education;Passive range of motion;Taping;Traction    PT Next Visit Plan Dry needling  piriformis, glut med; PA mobs R SI, try muscle energy both sides to determine which relieves pain, core stability, TA activation    Consulted and Agree with Plan of Care Patient             Patient will benefit from skilled therapeutic intervention in order to improve the following deficits and impairments:  Pain, Hypomobility, Impaired flexibility, Increased fascial restricitons, Decreased range of motion, Decreased strength, Decreased activity tolerance  Visit Diagnosis: Acute right-sided low back pain without sciatica - Plan: PT plan of care cert/re-cert  Other symptoms and signs involving the musculoskeletal system - Plan: PT plan of care cert/re-cert     Problem List Patient Active Problem List   Diagnosis Date Noted   Nausea vomiting and diarrhea 03/21/2022   Viral syndrome 02/12/2022   Adult ADHD 01/10/2022  Degeneration of thoracolumbar intervertebral disc 01/20/2021   Annual physical exam 09/02/2020   Anxiety and depression 08/26/2020   Abnormal weight gain 07/28/2020    Karianne Nogueira, PT 04/25/2022, 1:51 PM  [facility] [address] [place], [place], [address] Phone: [phone]   Fax:  [phone]  Name: LEMOYNE NESTOR, MD MRN: [id number] Date of Birth: [birth year]

## 2022-04-25 NOTE — Patient Instructions (Signed)
Access Code: [email or web address] URL: [email or web address] Date: 04/25/2022 Prepared by: Margretta Ditty  Program Notes *  Exercises - Seated Table Hamstring Stretch  - 2 x daily - 7 x weekly - 1 sets - 2 reps - 30 seconds hold - Seated Figure 4 Piriformis Stretch  - 2 x daily - 7 x weekly - 1 sets - 3 reps - 30 seconds hold - Wall Squat  - 2 x daily - 7 x weekly - 1 sets - 10 reps - Glute Max Release With Campbell [employer] Against Wall  - 2 x daily - 7 x weekly - 1 sets - 10 reps - Hooklying SI Joint Self-Correction  - 2 x daily - 7 x weekly - 1 sets - 5 reps

## 2022-04-30 ENCOUNTER — Ambulatory Visit: Payer: 59 | Admitting: Rehabilitative and Restorative Service Providers"

## 2022-04-30 ENCOUNTER — Encounter: Payer: Self-pay | Admitting: Rehabilitative and Restorative Service Providers"

## 2022-04-30 DIAGNOSIS — R29898 Other symptoms and signs involving the musculoskeletal system: Secondary | ICD-10-CM

## 2022-04-30 DIAGNOSIS — M545 Low back pain, unspecified: Secondary | ICD-10-CM

## 2022-04-30 DIAGNOSIS — M5135 Other intervertebral disc degeneration, thoracolumbar region: Secondary | ICD-10-CM | POA: Diagnosis not present

## 2022-04-30 NOTE — Therapy (Signed)
[facility] [place] [address] [place], [place], [address] Phone: [phone]   Fax:  [phone]  Physical Therapy Treatment  Patient Details  Name: Austin CRACE, Austin [name] MRN: [id number] Date of Birth: 25-Nov-1985 Referring Provider (PT): Rodney Langton, Austin [name]   Encounter Date: 04/30/2022   PT End of Session - 04/30/22 1153     Visit Number 2    Number of Visits 16    Date for PT Re-Evaluation 06/24/22    Authorization Type UMR    PT Start Time 1148    PT Stop Time 1230    PT Time Calculation (min) 42 min    Activity Tolerance Patient tolerated treatment well;Patient limited by pain    Behavior During Therapy [facility] for tasks assessed/performed             History reviewed. No pertinent past medical history.  History reviewed. No pertinent surgical history.  There were no vitals filed for this visit.   Subjective Assessment - 04/30/22 1151     Subjective The patient reports nothing worse with exercises.  He is tolerating the exercises, notes stiffness today.    Pertinent History T11- T 12 injection, R SI injection.    Patient Stated Goals reduce pain, improve mobility, strengthen the knee    Currently in Pain? Yes    Pain Score 4     Pain Location Buttocks    Pain Orientation Right    Pain Descriptors / Indicators Aching    Pain Type Chronic pain    Pain Radiating Towards R hip down into knee and anterior shin (L4 region)    Pain Onset More than a month ago    Pain Frequency Constant    Aggravating Factors  walking, standing    Pain Relieving Factors prednisone                [facility] PT Assessment - 04/30/22 1153       Assessment   Medical Diagnosis Lumbar L4 radiculopathy    Referring Provider (PT) Rodney Langton, Austin [name]    Onset Date/Surgical Date 04/17/22                           [facility] Adult PT Treatment/Exercise - 04/30/22 1154       Exercises   Exercises Lumbar;Knee/Hip       Lumbar Exercises: Stretches   Hip Flexor Stretch Right;Left;2 reps;30 seconds    Hip Flexor Stretch Limitations seated and supine -- more tightness and discomfort on the R side.  Added for HEP    Piriformis Stretch Right;Left;2 reps;30 seconds    Piriformis Stretch Limitations supine      Lumbar Exercises: Aerobic   Tread Mill 3.5 minutes      Lumbar Exercises: Supine   Other Supine Lumbar Exercises isometric hip flexion R and L      Lumbar Exercises: Quadruped   Madcat/Old Horse 5 reps    Madcat/Old Horse Limitations pain with flexion      Modalities   Modalities Moist Heat;Electrical Stimulation      Moist Heat Therapy   Number Minutes Moist Heat 10 Minutes    Moist Heat Location Lumbar Spine      Electrical Stimulation   Electrical Stimulation Location R gluts    Electrical Stimulation Action interferential    Electrical Stimulation Parameters to tolerance    Electrical Stimulation Goals Pain      Manual Therapy   Manual Therapy Joint  mobilization;Soft tissue mobilization    Manual therapy comments skilled palpation to assess response to STM and DN    Joint Mobilization R SI joint grade II-III PA    Soft tissue mobilization STM gluts R side and piriformis, STM along lateral sacral border on the R side              Trigger Point Dry Needling - 04/30/22 1232     Consent Given? Yes    Education Handout Provided Previously provided    Muscles Treated Back/Hip Gluteus medius;Piriformis;Gluteus maximus    Dry Needling Comments R side only    Gluteus Medius Response Palpable increased muscle length    Gluteus Maximus Response Palpable increased muscle length    Piriformis Response Palpable increased muscle length                   PT Education - 04/30/22 1236     Education Details modified HEP    Person(s) Educated Patient    Methods Explanation;Demonstration;Handout    Comprehension Verbalized understanding;Returned demonstration               PT Short Term Goals - 04/25/22 1251       PT SHORT TERM GOAL #1   Title The patient will be indep with HEP for hip stability, core stability, flexibility, and self mobilization.    Time 4    Period Weeks    Target Date 05/25/22      PT SHORT TERM GOAL #2   Title The patient will report no pain at rest in sitting.    Time 4    Period Weeks    Target Date 05/25/22      PT SHORT TERM GOAL #3   Title The patient will report reduced incidence of tingling into anterior distal LE.    Time 4    Period Weeks    Target Date 05/25/22               PT Long Term Goals - 04/25/22 1253       PT LONG TERM GOAL #1   Title The patient will be indep with progression of HEP.    Time 8    Period Weeks    Target Date 06/24/22      PT LONG TERM GOAL #2   Title The patient will improve R hip flexor, hip abductor, and knee extensor strength to 5/5.    Time 8    Period Weeks    Target Date 06/24/22      PT LONG TERM GOAL #3   Title The patient will demo full squat with return to standing without c/o R leg weakness/instability.    Time 8    Period Weeks    Target Date 06/24/22      PT LONG TERM GOAL #4   Title The patient will report pain < or equal to 2/10.    Time 8    Period Weeks    Target Date 06/24/22                   Plan - 04/30/22 1234     Clinical Impression Statement The patient tolerated DN and STM well today in R hip with some sensation of muscle release.  Used heat and e-stim at end to reduce soreness post manual work.  PT also added HEP for hip flexor stretching.    Personal Factors and Comorbidities Comorbidity 2    Comorbidities L4 radiculopathy, Thoracic disc  protrusion    Stability/Clinical Decision Making Stable/Uncomplicated    Rehab Potential Good    PT Frequency 2x / week    PT Duration 8 weeks    PT Treatment/Interventions ADLs/Self Care Home Management;Manual techniques;Patient/family education;Moist Heat;Electrical Stimulation;Dry  needling;Therapeutic exercise;Therapeutic activities;Neuromuscular re-education;Passive range of motion;Taping;Traction    PT Next Visit Plan Dry needling piriformis, glut med; PA mobs R SI, try muscle energy both sides to determine which relieves pain, core stability, TA activation    Consulted and Agree with Plan of Care Patient             Patient will benefit from skilled therapeutic intervention in order to improve the following deficits and impairments:  Pain, Hypomobility, Impaired flexibility, Increased fascial restricitons, Decreased range of motion, Decreased strength, Decreased activity tolerance  Visit Diagnosis: Acute right-sided low back pain without sciatica  Other symptoms and signs involving the musculoskeletal system     Problem List Patient Active Problem List   Diagnosis Date Noted   Nausea vomiting and diarrhea 03/21/2022   Viral syndrome 02/12/2022   Adult ADHD 01/10/2022   Degeneration of thoracolumbar intervertebral disc 01/20/2021   Annual physical exam 09/02/2020   Anxiety and depression 08/26/2020   Abnormal weight gain 07/28/2020    Halford Goetzke, PT 04/30/2022, 12:44 PM  [facility] [place] [address] [place], [place], [address] Phone: [phone]   Fax:  [phone]  Name: Austin STOWERS, Austin [name] MRN: [id number] Date of Birth: 1986-03-24

## 2022-04-30 NOTE — Patient Instructions (Signed)
Access Code: [email or web address] URL: [email or web address] Date: 04/30/2022 Prepared by: Margretta Ditty  Program Notes *  Exercises - Seated Table Hamstring Stretch  - 2 x daily - 7 x weekly - 1 sets - 2 reps - 30 seconds hold - Seated Figure 4 Piriformis Stretch  - 2 x daily - 7 x weekly - 1 sets - 3 reps - 30 seconds hold - Wall Squat  - 2 x daily - 7 x weekly - 1 sets - 10 reps - Supine Piriformis Stretch with Leg Straight  - 2 x daily - 7 x weekly - 1 sets - 10 reps - Hip Flexor Stretch at Edge of Bed  - 2 x daily - 7 x weekly - 1 sets - 10 reps - Glute Max Release With Campbell Soup Against Wall  - 2 x daily - 7 x weekly - 1 sets - 10 reps

## 2022-05-02 ENCOUNTER — Ambulatory Visit: Payer: 59 | Admitting: Rehabilitative and Restorative Service Providers"

## 2022-05-02 DIAGNOSIS — M545 Low back pain, unspecified: Secondary | ICD-10-CM | POA: Diagnosis not present

## 2022-05-02 DIAGNOSIS — R29898 Other symptoms and signs involving the musculoskeletal system: Secondary | ICD-10-CM

## 2022-05-02 DIAGNOSIS — M5135 Other intervertebral disc degeneration, thoracolumbar region: Secondary | ICD-10-CM | POA: Diagnosis not present

## 2022-05-02 NOTE — Therapy (Signed)
[facility] [place] [address]  [address] [place], [place], [address] Phone: [phone]   Fax:  [phone]  Physical Therapy Treatment  Patient Details  Name: Austin DESMITH, Austin [name] MRN: [id number] Date of Birth: [birth year] Referring Provider (PT): Rodney Langton, Austin [name]   Encounter Date: 05/02/2022   PT End of Session - 05/02/22 1226     Visit Number 3    Number of Visits 16    Date for PT Re-Evaluation 06/24/22    Authorization Type UMR    PT Start Time 1147    PT Stop Time 1225    PT Time Calculation (min) 38 min    Activity Tolerance Patient tolerated treatment well;Patient limited by pain    Behavior During Therapy [facility] for tasks assessed/performed             No past medical history on file.  No past surgical history on file.  There were no vitals filed for this visit.   Subjective Assessment - 05/02/22 1200     Subjective The patient reports reduced overall stiffness.    Pertinent History T11- T 12 injection, R SI injection.    Patient Stated Goals reduce pain, improve mobility, strengthen the knee    Currently in Pain? Yes    Pain Score 4     Pain Location Buttocks    Pain Orientation Right    Pain Descriptors / Indicators Aching;Sore    Pain Type Chronic pain    Pain Onset More than a month ago    Pain Frequency Constant    Aggravating Factors  walking;standing    Pain Relieving Factors prednisone                [facility] PT Assessment - 05/02/22 1201       Assessment   Medical Diagnosis Lumbar L4 radiculopathy    Referring Provider (PT) Rodney Langton, Austin [name]    Onset Date/Surgical Date 04/17/22                           [facility] Adult PT Treatment/Exercise - 05/02/22 1202       Exercises   Exercises Lumbar;Knee/Hip      Lumbar Exercises: Stretches   Active Hamstring Stretch Right;Left;1 rep;30 seconds    Quad Stretch Right;Left;1 rep;60 seconds      Lumbar Exercises: Standing    Other Standing Lumbar Exercises standing step ups to 4" step x 10 reps R and L with difference noted between sides      Lumbar Exercises: Prone   Other Prone Lumbar Exercises plank increases soreness      Lumbar Exercises: Quadruped   Madcat/Old Horse 10 reps    Madcat/Old Horse Limitations no pain today, mobilization with movement into extension of lumbar spine (tightness today)    Opposite Arm/Leg Raise Right arm/Left leg;Left arm/Right leg;5 reps      Modalities   Modalities Moist Heat      Moist Heat Therapy   Number Minutes Moist Heat 10 Minutes    Moist Heat Location Lumbar Spine      Electrical Stimulation   Electrical Stimulation Location R gluts    Electrical Stimulation Action dry needling with e-stim for muscle contraction    Electrical Stimulation Parameters to tolerance    Electrical Stimulation Goals Pain      Manual Therapy   Manual Therapy Joint mobilization;Soft tissue mobilization    Manual therapy comments skilled palpation to assess response to STM and  DN    Joint Mobilization R SI joint grade II-III PA    Soft tissue mobilization lumbar soft tissue mobilization with heel sitting and STM in prone position to gluts-- noticeable change after DN + e-stim              Trigger Point Dry Needling - 05/02/22 1213     Consent Given? Yes    Education Handout Provided Previously provided    Muscles Treated Back/Hip Gluteus medius;Piriformis;Gluteus maximus    Dry Needling Comments R side only    Electrical Stimulation Performed with Dry Needling Yes   2 channels intensity to tolerance, 2 pps, 2 milliamps x 5 minutes                    PT Short Term Goals - 04/25/22 1251       PT SHORT TERM GOAL #1   Title The patient will be indep with HEP for hip stability, core stability, flexibility, and self mobilization.    Time 4    Period Weeks    Target Date 05/25/22      PT SHORT TERM GOAL #2   Title The patient will report no pain at rest in  sitting.    Time 4    Period Weeks    Target Date 05/25/22      PT SHORT TERM GOAL #3   Title The patient will report reduced incidence of tingling into anterior distal LE.    Time 4    Period Weeks    Target Date 05/25/22               PT Long Term Goals - 04/25/22 1253       PT LONG TERM GOAL #1   Title The patient will be indep with progression of HEP.    Time 8    Period Weeks    Target Date 06/24/22      PT LONG TERM GOAL #2   Title The patient will improve R hip flexor, hip abductor, and knee extensor strength to 5/5.    Time 8    Period Weeks    Target Date 06/24/22      PT LONG TERM GOAL #3   Title The patient will demo full squat with return to standing without c/o R leg weakness/instability.    Time 8    Period Weeks    Target Date 06/24/22      PT LONG TERM GOAL #4   Title The patient will report pain < or equal to 2/10.    Time 8    Period Weeks    Target Date 06/24/22                   Plan - 05/02/22 1226     Clinical Impression Statement The patient notices less pain with flexion in quadriped today.  He has tightness with lumbar extension during quadriped activities.  Pain also noted when attempting a plank on elbows.  PT continuing with LE strengthening, stretching, and STM to release trigger points.  Plan to continue progressing to tolerance.    PT Frequency 2x / week    PT Duration 8 weeks    PT Treatment/Interventions ADLs/Self Care Home Management;Manual techniques;Patient/family education;Moist Heat;Electrical Stimulation;Dry needling;Therapeutic exercise;Therapeutic activities;Neuromuscular re-education;Passive range of motion;Taping;Traction    PT Next Visit Plan Dry needling piriformis, glut med; PA mobs R SI, try muscle energy both sides to determine which relieves pain, core stability, TA activation  Consulted and Agree with Plan of Care Patient             Patient will benefit from skilled therapeutic intervention in  order to improve the following deficits and impairments:  Pain, Hypomobility, Impaired flexibility, Increased fascial restricitons, Decreased range of motion, Decreased strength, Decreased activity tolerance  Visit Diagnosis: Acute right-sided low back pain without sciatica  Other symptoms and signs involving the musculoskeletal system     Problem List Patient Active Problem List   Diagnosis Date Noted   Nausea vomiting and diarrhea 03/21/2022   Viral syndrome 02/12/2022   Adult ADHD 01/10/2022   Degeneration of thoracolumbar intervertebral disc 01/20/2021   Annual physical exam 09/02/2020   Anxiety and depression 08/26/2020   Abnormal weight gain 07/28/2020    Japhet Morgenthaler, PT 05/02/2022, 12:29 PM  [facility] [address] [place] [address] [place], [place], [address] Phone: [phone]   Fax:  [phone]  Name: Austin LOVERN, Austin [name] MRN: [id number] Date of Birth: 08/03/1986

## 2022-05-03 ENCOUNTER — Other Ambulatory Visit: Payer: Self-pay | Admitting: Sports Medicine

## 2022-05-03 DIAGNOSIS — M5136 Other intervertebral disc degeneration, lumbar region: Secondary | ICD-10-CM

## 2022-05-09 ENCOUNTER — Encounter: Payer: Self-pay | Admitting: Rehabilitative and Restorative Service Providers"

## 2022-05-09 ENCOUNTER — Ambulatory Visit: Payer: 59 | Admitting: Rehabilitative and Restorative Service Providers"

## 2022-05-09 DIAGNOSIS — M545 Low back pain, unspecified: Secondary | ICD-10-CM | POA: Diagnosis not present

## 2022-05-09 DIAGNOSIS — R29898 Other symptoms and signs involving the musculoskeletal system: Secondary | ICD-10-CM

## 2022-05-09 DIAGNOSIS — M5135 Other intervertebral disc degeneration, thoracolumbar region: Secondary | ICD-10-CM | POA: Diagnosis not present

## 2022-05-09 NOTE — Therapy (Signed)
OUTPATIENT PHYSICAL THERAPY TREATMENT NOTE   Patient Name: Austin SPELLMAN, MD MRN: [id number] DOB:[birth year], 36 y.o., male Today's Date: 05/09/2022  PCP: Rodney Langton, MD REFERRING PROVIDER: Rodney Langton, MD   PT End of Session - 05/09/22 1236     Visit Number 4    Number of Visits 16    Date for PT Re-Evaluation 06/24/22    Authorization Type UMR    PT Start Time 1148    PT Stop Time 1240    PT Time Calculation (min) 52 min    Activity Tolerance Patient tolerated treatment well;Patient limited by pain    Behavior During Therapy [facility] for tasks assessed/performed             History reviewed. No pertinent past medical history. History reviewed. No pertinent surgical history. Patient Active Problem List   Diagnosis Date Noted   Nausea vomiting and diarrhea 03/21/2022   Viral syndrome 02/12/2022   Adult ADHD 01/10/2022   Degeneration of thoracolumbar intervertebral disc 01/20/2021   Annual physical exam 09/02/2020   Anxiety and depression 08/26/2020   Abnormal weight gain 07/28/2020    REFERRING DIAG: Lumbar Degenerative Disc Disease  THERAPY DIAG:  Acute right-sided low back pain without sciatica - Plan: PT plan of care cert/re-cert   Other symptoms and signs involving the musculoskeletal system - Plan: PT plan of care cert/re-cert    Rationale for Evaluation and Treatment Rehabilitation  PERTINENT HISTORY: The patient is known from our clinic from last year for low back therapy.  He was doing well until 3 months ago when he lifted a shop vac and twisted.  Pain has worsened and is at R SI radiating into gluts, piriformis, and to the knee. He has experienced 2 episodes of R knee buckling due to weakness.  He has hyporeflexia in patellar tendon.  PRECAUTIONS: Fall due to knee buckling  SUBJECTIVE: The patient was able to walk around [place] this weekend, but did note increased tightness.  He notes over 50% reduction in pain since beginning  therapy.  PAIN:  Are you having pain? Yes: NPRS scale: 3-4/10 Pain location: buttocks Pain description: aching; spasm;discomfort;sore Aggravating factors: end of day Relieving factors: rest, exercise    TODAY'S TREATMENT:  05/09/22  THEREX:    Treadmill 4 minutes 1.8 mph to 2.5 mph  Supine Bridges 10 reps Notes mild pain, improved from eval  Prone hip extension 10 reps No pain/ more weakness on R as compared to L  Sidelying hip abduction 10 reps Cues for hip extension  Quadriped  Cat/cow x 10 reps With overpressure into extension  Side plank X 5 reps   Modified to bent knees to reduce pain.  Sit<>stand squats  X 5 reps Increased pain lateral thigh-- did not add for HEP  Self:    Foam roller R gluts To tolerance with sitting and supine rocking to roll out for muscle release  MANUAL:    Trigger point DN See below   STM R gluts  For pain, muscle release  Muscle energy Hip flexion R and glut ext L Manual resistance x 2 reps  E-stim with DN 5 minutes  R gluts    Trigger Point Dry Needling, Manual Therapy Treatment:  Initial or subsequent education regarding Trigger Point Dry Needling: Subsequent Did patient give consent to treatment with Trigger Point Dry Needling: Yes TPDN with skilled palpation and monitoring followed by STM to the following muscles: glut medius, glut maximus, piriformis     PATIENT EDUCATION: Education  details: HEP Person educated: Patient Education method: Explanation, Demonstration, and Verbal cues Education comprehension: verbalized understanding and returned demonstration   HOME EXERCISE PROGRAM: Access Code: [email or web address] URL: [email or web address] Date: 04/30/2022 Prepared by: Margretta Ditty   Exercises - Seated Table Hamstring Stretch  - 2 x daily - 7 x weekly - 1 sets - 2 reps - 30 seconds hold - Seated Figure 4 Piriformis Stretch  - 2 x daily - 7 x weekly - 1 sets - 3 reps - 30 seconds hold - Wall Squat  - 2 x daily - 7 x weekly  - 1 sets - 10 reps - Supine Piriformis Stretch with Leg Straight  - 2 x daily - 7 x weekly - 1 sets - 10 reps - Hip Flexor Stretch at Edge of Bed  - 2 x daily - 7 x weekly - 1 sets - 10 reps - Glute Max Release With [employer] Against Wall  - 2 x daily - 7 x weekly - 1 sets - 10 reps   PT Short Term Goals -      PT SHORT TERM GOAL #1   Title The patient will be indep with HEP for hip stability, core stability, flexibility, and self mobilization.    Time 4    Period Weeks    Target Date 05/25/22      PT SHORT TERM GOAL #2   Title The patient will report no pain at rest in sitting.    Time 4    Period Weeks    Target Date 05/25/22      PT SHORT TERM GOAL #3   Title The patient will report reduced incidence of tingling into anterior distal LE.    Time 4    Period Weeks    Target Date 05/25/22              PT Long Term Goals       PT LONG TERM GOAL #1   Title The patient will be indep with progression of HEP.    Time 8    Period Weeks    Target Date 06/24/22      PT LONG TERM GOAL #2   Title The patient will improve R hip flexor, hip abductor, and knee extensor strength to 5/5.    Time 8    Period Weeks    Target Date 06/24/22      PT LONG TERM GOAL #3   Title The patient will demo full squat with return to standing without c/o R leg weakness/instability.    Time 8    Period Weeks    Target Date 06/24/22      PT LONG TERM GOAL #4   Title The patient will report pain < or equal to 2/10.    Time 8    Period Weeks    Target Date 06/24/22              Plan     Clinical Impression Statement The patient is noting improvement in pain and tightness.  He continues to get radiating symptoms into R thigh with progressing strengthening activities.  Plan to continue to work on STM, DN with e-stim, ROM, joint mobility, and flexibility.    PT Frequency 2x / week    PT Duration 8 weeks    PT Treatment/Interventions ADLs/Self Care Home Management;Manual  techniques;Patient/family education;Moist Heat;Electrical Stimulation;Dry needling;Therapeutic exercise;Therapeutic activities;Neuromuscular re-education;Passive range of motion;Taping;Traction    PT Next Visit Plan HEP progression (adding more strength), STN, DN,  stretching, core stability    Consulted and Agree with Plan of Care Patient               Gilliam Hawkes, PT 05/09/2022, 2:52 PM

## 2022-05-14 ENCOUNTER — Ambulatory Visit: Payer: 59 | Admitting: Rehabilitative and Restorative Service Providers"

## 2022-05-16 ENCOUNTER — Encounter: Payer: Self-pay | Admitting: Physical Therapy

## 2022-05-16 ENCOUNTER — Ambulatory Visit: Payer: 59 | Admitting: Physical Therapy

## 2022-05-16 DIAGNOSIS — M545 Low back pain, unspecified: Secondary | ICD-10-CM | POA: Diagnosis not present

## 2022-05-16 DIAGNOSIS — M5135 Other intervertebral disc degeneration, thoracolumbar region: Secondary | ICD-10-CM | POA: Diagnosis not present

## 2022-05-16 DIAGNOSIS — R29898 Other symptoms and signs involving the musculoskeletal system: Secondary | ICD-10-CM | POA: Diagnosis not present

## 2022-05-16 NOTE — Therapy (Signed)
OUTPATIENT PHYSICAL THERAPY TREATMENT NOTE   Patient Name: Austin CHESNUT, MD MRN: [id number] DOB:Mar 21, 1986, 36 y.o., male Today's Date: 05/16/2022  PCP: Rodney Langton, MD REFERRING PROVIDER: Rodney Langton, MD   PT End of Session - 05/16/22 1228     Visit Number 5    Number of Visits 16    Date for PT Re-Evaluation 06/24/22    PT Start Time 1145    PT Stop Time 1228    PT Time Calculation (min) 43 min    Activity Tolerance Patient tolerated treatment well    Behavior During Therapy [facility] for tasks assessed/performed              History reviewed. No pertinent past medical history. History reviewed. No pertinent surgical history. Patient Active Problem List   Diagnosis Date Noted   Nausea vomiting and diarrhea 03/21/2022   Viral syndrome 02/12/2022   Adult ADHD 01/10/2022   Degeneration of thoracolumbar intervertebral disc 01/20/2021   Annual physical exam 09/02/2020   Anxiety and depression 08/26/2020   Abnormal weight gain 07/28/2020    REFERRING DIAG: Lumbar Degenerative Disc Disease  THERAPY DIAG:  Acute right-sided low back pain without sciatica - Plan: PT plan of care cert/re-cert   Other symptoms and signs involving the musculoskeletal system - Plan: PT plan of care cert/re-cert    Rationale for Evaluation and Treatment Rehabilitation  PERTINENT HISTORY: The patient is known from our clinic from last year for low back therapy.  He was doing well until 3 months ago when he lifted a shop vac and twisted.  Pain has worsened and is at R SI radiating into gluts, piriformis, and to the knee. He has experienced 2 episodes of R knee buckling due to weakness.  He has hyporeflexia in patellar tendon.  PRECAUTIONS: Fall due to knee buckling  SUBJECTIVE: The patient states his back still feels like it "tightens up" in the glute/SIJ area. He reports feeling much better than when he started PT  PAIN:  Are you having pain? Yes: NPRS scale: 3-4/10 Pain  location: buttocks Pain description: aching; spasm;discomfort;sore Aggravating factors: end of day Relieving factors: rest, exercise    TODAY'S TREATMENT:  05/16/22  THEREX:    Treadmill 5 minutes 1.8 mph to 2.5 mph  Supine Bridges 10 reps No pain  Prone hip extension 10 reps No pain - increased difficult with Lt vs Rt  Sidelying hip abduction 10 reps Cues for hip extension  Quadriped  Cat/cow x 10 reps With overpressure into extension  Side plank 2 x 10 sec bilat  Modified to bent knees to reduce pain.  Sit<>stand squats  X 10 reps Increased fatigue - no pain  Forward step ups 4'' step x 10, 6'' x 10 with 1 UE support   Self:    Foam roller    MANUAL:    Trigger point DN See below   STM R gluts  For pain, muscle release  Muscle energy    E-stim with DN 5 minutes  R gluts    Trigger Point Dry Needling, Manual Therapy Treatment:  Initial or subsequent education regarding Trigger Point Dry Needling: Subsequent Did patient give consent to treatment with Trigger Point Dry Needling: Yes TPDN with skilled palpation and monitoring followed by STM to the following muscles: glut medius, glut maximus, piriformis     PATIENT EDUCATION: Education details: HEP Person educated: Patient Education method: Programmer, multimedia, Demonstration, and Verbal cues Education comprehension: verbalized understanding and returned demonstration   HOME EXERCISE PROGRAM:  Access Code: [email or web address] URL: [email or web address] Date: 04/30/2022 Prepared by: Margretta Ditty   Exercises - Seated Table Hamstring Stretch  - 2 x daily - 7 x weekly - 1 sets - 2 reps - 30 seconds hold - Seated Figure 4 Piriformis Stretch  - 2 x daily - 7 x weekly - 1 sets - 3 reps - 30 seconds hold - Wall Squat  - 2 x daily - 7 x weekly - 1 sets - 10 reps - Supine Piriformis Stretch with Leg Straight  - 2 x daily - 7 x weekly - 1 sets - 10 reps - Hip Flexor Stretch at Edge of Bed  - 2 x daily - 7 x weekly - 1 sets - 10  reps - Glute Max Release With [employer] Against Wall  - 2 x daily - 7 x weekly - 1 sets - 10 reps   PT Short Term Goals -      PT SHORT TERM GOAL #1   Title The patient will be indep with HEP for hip stability, core stability, flexibility, and self mobilization.    Time 4    Period Weeks    Target Date 05/25/22      PT SHORT TERM GOAL #2   Title The patient will report no pain at rest in sitting.    Time 4    Period Weeks    Target Date 05/25/22      PT SHORT TERM GOAL #3   Title The patient will report reduced incidence of tingling into anterior distal LE.    Time 4    Period Weeks    Target Date 05/25/22              PT Long Term Goals       PT LONG TERM GOAL #1   Title The patient will be indep with progression of HEP.    Time 8    Period Weeks    Target Date 06/24/22      PT LONG TERM GOAL #2   Title The patient will improve R hip flexor, hip abductor, and knee extensor strength to 5/5.    Time 8    Period Weeks    Target Date 06/24/22      PT LONG TERM GOAL #3   Title The patient will demo full squat with return to standing without c/o R leg weakness/instability.    Time 8    Period Weeks    Target Date 06/24/22      PT LONG TERM GOAL #4   Title The patient will report pain < or equal to 2/10.    Time 8    Period Weeks    Target Date 06/24/22              Plan     Clinical Impression Statement Added step ups to improve Rt LE strength, fatigues after set of 10. Better tolerance to sit to stand squats today.   PT Frequency 2x / week    PT Duration 8 weeks    PT Treatment/Interventions ADLs/Self Care Home Management;Manual techniques;Patient/family education;Moist Heat;Electrical Stimulation;Dry needling;Therapeutic exercise;Therapeutic activities;Neuromuscular re-education;Passive range of motion;Taping;Traction    PT Next Visit Plan HEP progression (adding more strength), STN, DN, stretching, core stability    Consulted and Agree with Plan  of Care Patient               Alicha Raspberry, PT 05/16/2022, 12:29 PM

## 2022-05-21 ENCOUNTER — Ambulatory Visit: Payer: 59 | Admitting: Rehabilitative and Restorative Service Providers"

## 2022-05-23 ENCOUNTER — Other Ambulatory Visit (HOSPITAL_BASED_OUTPATIENT_CLINIC_OR_DEPARTMENT_OTHER): Payer: Self-pay

## 2022-05-23 ENCOUNTER — Encounter: Payer: 59 | Admitting: Rehabilitative and Restorative Service Providers"

## 2022-05-24 ENCOUNTER — Encounter: Payer: 59 | Admitting: Rehabilitative and Restorative Service Providers"

## 2022-05-28 ENCOUNTER — Telehealth: Payer: Self-pay | Admitting: Sports Medicine

## 2022-05-28 DIAGNOSIS — R112 Nausea with vomiting, unspecified: Secondary | ICD-10-CM

## 2022-05-28 NOTE — Telephone Encounter (Signed)
Pleasant 36 year old male calls back, he continues to have right flank, versus right abdominal pain.  Multiple imaging studies have been negative, proceeding with CT abdomen and pelvis with oral and IV contrast.

## 2022-05-28 NOTE — Assessment & Plan Note (Signed)
Persistent right flank pain, question abscess, mass, diverticulitis, renal structural abnormality, proceeding with CT abdomen and pelvis with oral and IV contrast.

## 2022-05-30 ENCOUNTER — Ambulatory Visit: Payer: 59 | Attending: Sports Medicine | Admitting: Rehabilitative and Restorative Service Providers"

## 2022-05-30 ENCOUNTER — Ambulatory Visit (INDEPENDENT_AMBULATORY_CARE_PROVIDER_SITE_OTHER): Payer: 59

## 2022-05-30 ENCOUNTER — Encounter: Payer: Self-pay | Admitting: Rehabilitative and Restorative Service Providers"

## 2022-05-30 DIAGNOSIS — R197 Diarrhea, unspecified: Secondary | ICD-10-CM

## 2022-05-30 DIAGNOSIS — R1030 Lower abdominal pain, unspecified: Secondary | ICD-10-CM

## 2022-05-30 DIAGNOSIS — K5900 Constipation, unspecified: Secondary | ICD-10-CM | POA: Diagnosis not present

## 2022-05-30 DIAGNOSIS — R29898 Other symptoms and signs involving the musculoskeletal system: Secondary | ICD-10-CM | POA: Insufficient documentation

## 2022-05-30 DIAGNOSIS — R112 Nausea with vomiting, unspecified: Secondary | ICD-10-CM | POA: Diagnosis not present

## 2022-05-30 DIAGNOSIS — M545 Low back pain, unspecified: Secondary | ICD-10-CM | POA: Insufficient documentation

## 2022-05-30 DIAGNOSIS — R109 Unspecified abdominal pain: Secondary | ICD-10-CM | POA: Diagnosis not present

## 2022-05-30 MED ORDER — IOHEXOL 350 MG/ML SOLN: 100.0000 mL | Freq: Once | INTRAVENOUS | Status: AC | PRN

## 2022-05-30 MED ORDER — IOHEXOL 350 MG/ML SOLN
100.0000 mL | Freq: Once | INTRAVENOUS | Status: AC | PRN
  Administered 2022-05-30: 100 mL via INTRAVENOUS

## 2022-05-30 NOTE — Therapy (Signed)
OUTPATIENT PHYSICAL THERAPY TREATMENT NOTE   Patient Name: Austin SCHIPPERS, Austin [name] MRN: [id number] DOB:1986-10-07, 36 y.o., male Today's Date: 05/30/2022  PCP: Rodney Langton, Austin [name] REFERRING PROVIDER: Rodney Langton, Austin [name]   PT End of Session - 05/30/22 1149     Visit Number 6    Number of Visits 16    Date for PT Re-Evaluation 06/24/22    PT Start Time 1146    PT Stop Time 1235    PT Time Calculation (min) 49 min    Activity Tolerance Patient tolerated treatment well              History reviewed. No pertinent past medical history. History reviewed. No pertinent surgical history. Patient Active Problem List   Diagnosis Date Noted   Nausea vomiting and diarrhea 03/21/2022   Viral syndrome 02/12/2022   Adult ADHD 01/10/2022   Degeneration of thoracolumbar intervertebral disc 01/20/2021   Annual physical exam 09/02/2020   Anxiety and depression 08/26/2020   Abnormal weight gain 07/28/2020    REFERRING DIAG: Lumbar Degenerative Disc Disease  THERAPY DIAG:  Acute right-sided low back pain without sciatica - Plan: PT plan of care cert/re-cert   Other symptoms and signs involving the musculoskeletal system - Plan: PT plan of care cert/re-cert    Rationale for Evaluation and Treatment Rehabilitation  PERTINENT HISTORY: The patient is known from our clinic from last year for low back therapy.  He was doing well until 3 months ago when he lifted a shop vac and twisted.  Pain has worsened and is at R SI radiating into gluts, piriformis, and to the knee. He has experienced 2 episodes of R knee buckling due to weakness.  He has hyporeflexia in patellar tendon.  PRECAUTIONS: Fall due to knee buckling  SUBJECTIVE: Pain and tightness in the Rt buttocks is worse today. He was at the beach last week and walking on the sand a lot which irritated the symptoms. The patient states his back still feels like it "tightens up" in the glute/SIJ area.   PAIN:  Are you having pain?  Yes: NPRS scale: 4/10 Pain location: Rt buttocks Pain description: aching; spasm;discomfort;sore Aggravating factors: end of day Relieving factors: rest, exercise    TODAY'S TREATMENT:  05/30/22  THEREX:    Treadmill 5 minutes 1.8 mph to 2.5 mph  Supine Bridges HEP  No pain  Prone hip extension HEP  No pain - increased difficult with Lt vs Rt  Sidelying hip abduction HEP  Cues for hip extension  Quadriped  hEP  With overpressure into extension  Side plank HEP   Modified to bent knees to reduce pain.  Sit<>stand squats  HEP  Increased fatigue - no pain  Forward step ups HEP     4 part core   10 sec hold x 5  Supine   Hip flexor stretch  Half kneeling 30 sec x 3 reps    Hip flexor stretch  Thomas position supine 30 sec x 2 reps each side   pulling both knees to chest  Piriformis stretch   30 sec x 3 reps  Supine travell varying angles of stretch   Hip adductor stretch  30 sec x 3 reps  Supine with strap  Self:    Foam roller    MANUAL:    Trigger point DN See below   STM R gluts  For pain, muscle release  Muscle energy    E-stim with DN 5 minutes   R gluts  Modalities:  Moist heat lumbar spine/buttocks x 10 min   Trigger Point Dry Needling, Manual Therapy Treatment:  Initial or subsequent education regarding Trigger Point Dry Needling: Subsequent Did patient give consent to treatment with Trigger Point Dry Needling: Yes TPDN with skilled palpation and monitoring followed by STM to the following muscles: glut medius, glut maximus, piriformis   Assessment:  Note tightness in the hip flexors/quads/piriformis and gluts Rt lower quarter. Tightness in the Rt adductors.   PATIENT EDUCATION: Education details: HEP Person educated: Patient Education method: Solicitor, and Verbal cues Education comprehension: verbalized understanding and returned demonstration   HOME EXERCISE PROGRAM: Access Code: [email or web address] URL: [email or web address] Date:  05/30/2022 Prepared by: Corlis Leak  Exercises - Seated Table Hamstring Stretch  - 2 x daily - 7 x weekly - 1 sets - 2 reps - 30 seconds hold - Seated Figure 4 Piriformis Stretch  - 2 x daily - 7 x weekly - 1 sets - 3 reps - 30 seconds hold - Wall Squat  - 2 x daily - 7 x weekly - 1 sets - 10 reps - Supine Piriformis Stretch with Leg Straight  - 2 x daily - 7 x weekly - 1 sets - 10 reps - Hip Flexor Stretch at Edge of Bed  - 2 x daily - 7 x weekly - 1 sets - 10 reps - Glute Max Release With [employer] Against Wall  - 2 x daily - 7 x weekly - 1 sets - 10 reps - Supine Transversus Abdominis Bracing with Pelvic Floor Contraction  - 2 x daily - 7 x weekly - 1 sets - 10 reps - 10sec  hold - Hip Flexor Stretch at Edge of Bed  - 2 x daily - 7 x weekly - 1 sets - 3 reps - 30 sec  hold - Half Kneeling Hip Flexor Stretch  - 2 x daily - 7 x weekly - 1 sets - 3 reps - 30 sec  hold - Supine Piriformis Stretch with Leg Straight  - 2 x daily - 7 x weekly - 1 sets - 3 reps - 30 sec  hold   Program notes:     PT Short Term Goals -      PT SHORT TERM GOAL #1   Title The patient will be indep with HEP for hip stability, core stability, flexibility, and self mobilization.    Time 4    Period Weeks    Target Date 05/25/22      PT SHORT TERM GOAL #2   Title The patient will report no pain at rest in sitting.    Time 4    Period Weeks    Target Date 05/25/22      PT SHORT TERM GOAL #3   Title The patient will report reduced incidence of tingling into anterior distal LE.    Time 4    Period Weeks    Target Date 05/25/22              PT Long Term Goals       PT LONG TERM GOAL #1   Title The patient will be indep with progression of HEP.    Time 8    Period Weeks    Target Date 06/24/22      PT LONG TERM GOAL #2   Title The patient will improve R hip flexor, hip abductor, and knee extensor strength to 5/5.    Time 8    Period Weeks  Target Date 06/24/22      PT LONG TERM GOAL  #3   Title The patient will demo full squat with return to standing without c/o R leg weakness/instability.    Time 8    Period Weeks    Target Date 06/24/22      PT LONG TERM GOAL #4   Title The patient will report pain < or equal to 2/10.    Time 8    Period Weeks    Target Date 06/24/22              Plan     Clinical Impression Statement Increased tightness and pain today from vacation last week walking on sand at the beach. Noted muscular tightness in the Rt piriformis/gluts. Good response to manual work and stretching. Added stretch for hip flexors, adductor, piriformis Rt as well as transverse abdominal/4 part core. Needs to add core strengthening and stabilization.    PT Frequency 2x / week    PT Duration 8 weeks    PT Treatment/Interventions ADLs/Self Care Home Management;Manual techniques;Patient/family education;Moist Heat;Electrical Stimulation;Dry needling;Therapeutic exercise;Therapeutic activities;Neuromuscular re-education;Passive range of motion;Taping;Traction    PT Next Visit Plan HEP progression (adding more strength), STM, DN, stretching, core stability    Consulted and Agree with Plan of Care Patient               Val Riles, PT, MPH  05/30/2022, 12:59 PM

## 2022-06-06 ENCOUNTER — Ambulatory Visit: Payer: 59 | Admitting: Rehabilitative and Restorative Service Providers"

## 2022-06-06 DIAGNOSIS — M545 Low back pain, unspecified: Secondary | ICD-10-CM | POA: Diagnosis not present

## 2022-06-06 DIAGNOSIS — R29898 Other symptoms and signs involving the musculoskeletal system: Secondary | ICD-10-CM | POA: Diagnosis not present

## 2022-06-06 NOTE — Therapy (Signed)
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 No past surgical history on file. Patient Active Problem List   Diagnosis Date Noted   Nausea vomiting and diarrhea 03/21/2022   Viral syndrome 02/12/2022   Adult ADHD 01/10/2022   Degeneration of thoracolumbar intervertebral disc 01/20/2021   Annual physical exam 09/02/2020   Anxiety and depression 08/26/2020   Abnormal weight gain 07/28/2020    REFERRING DIAG: Lumbar Degenerative Disc Disease  THERAPY DIAG:  Acute right-sided low back pain without sciatica - Plan: PT plan of care cert/re-cert   Other symptoms and signs involving the musculoskeletal system - Plan: PT plan of care cert/re-cert    Rationale for Evaluation and Treatment Rehabilitation  PERTINENT HISTORY: The patient is known from our clinic from last year for low back therapy.  He was doing well until 3 months ago when he lifted a shop vac and twisted.  Pain has worsened and is at R SI radiating into gluts, piriformis, and to the knee. He has experienced 2 episodes of R knee buckling due to weakness.  He has hyporeflexia in patellar tendon.  PRECAUTIONS: Fall due to knee buckling  SUBJECTIVE: Notes increased strength with R knee-- demonstrates WNLs squat. Still getting regular sensation of "tightness" with mild discomfort.  PAIN:  Are you having pain? Yes: NPRS scale: 3/10 Pain location: Rt buttocks Pain description:  aching; spasm;discomfort;sore Aggravating factors: end of day Relieving factors: rest, exercise    TODAY'S TREATMENT:  06/06/22 THEREX: Treadmill x 4 minutes @ 2.2 mph for warmup  Standing single leg reaching to mat surface x 3 reps  Wall squat progressed to sit<>stand squat x 10 reps with cues for posterior weight shift  Hip standing IR stretch with R LE supported on stool  Standing single leg stance R and L x 10-15 seconds each  Hip flexor stretch prone with one leg off edge of mat  Prone press ups x 10 reps with R SI/ mobilization with movement P>A grade II-III   MANUAL: STM and DN in R hip, R SI P>A grade II-III, STM R QL   Trigger Point Dry-Needling  Treatment instructions: Expect mild to moderate muscle soreness.  Patient Consent Given: Yes Education handout provided: Previously provided Muscles treated: R piriformis, deep hip R side Treatment response/outcome: palpable lengthening *glut med felt less painful/tender today, therefore did not use e-stim or DN glut med today      TODAY'S TREATMENT:  05/30/22  THEREX:    Treadmill 5 minutes 1.8 mph to 2.5 mph  Supine Bridges HEP  No pain  Prone hip extension HEP  No pain - increased difficult with Lt vs Rt  Sidelying hip abduction HEP  Cues for hip extension  Quadriped  hEP  With overpressure into extension  Side plank HEP   Modified to bent knees to reduce  pain.  Sit<>stand squats  HEP  Increased fatigue - no pain  Forward step ups HEP     4 part core   10 sec hold x 5  Supine   Hip flexor stretch  Half kneeling 30 sec x 3 reps    Hip flexor stretch  Thomas position supine 30 sec x 2 reps each side   pulling both knees to chest  Piriformis stretch   30 sec x 3 reps  Supine travell varying angles of stretch   Hip adductor stretch  30 sec x 3 reps  Supine with strap  Self:    Foam roller    MANUAL:    Trigger point DN See below   STM R gluts  For pain, muscle release  Muscle energy    E-stim with DN 5 minutes    R gluts       Modalities:  Moist heat lumbar spine/buttocks x 10 min   Trigger Point Dry Needling, Manual Therapy Treatment:  Initial or subsequent education regarding Trigger Point Dry Needling: Subsequent Did patient give consent to treatment with Trigger Point Dry Needling: Yes TPDN with skilled palpation and monitoring followed by STM to the following muscles: glut medius, glut maximus, piriformis   Assessment:  Note tightness in the hip flexors/quads/piriformis and gluts Rt lower quarter. Tightness in the Rt adductors.    PATIENT EDUCATION: Education details: HEP Person educated: Patient Education method: Education officer, environmental, and Verbal cues Education comprehension: verbalized understanding and returned demonstration   HOME EXERCISE PROGRAM: Access Code: [email or web address] URL: [email or web address] Date: 06/06/2022 Prepared by: Rudell Cobb Exercises - Seated Table Hamstring Stretch  - 2 x daily - 7 x weekly - 1 sets - 2 reps - 30 seconds hold - Seated Figure 4 Piriformis Stretch  - 2 x daily - 7 x weekly - 1 sets - 3 reps - 30 seconds hold - Supine Piriformis Stretch with Leg Straight  - 2 x daily - 7 x weekly - 1 sets - 10 reps - Hip Flexor Stretch at Edge of Bed  - 2 x daily - 7 x weekly - 1 sets - 10 reps - Glute Max Release With [employer] Against Wall  - 2 x daily - 7 x weekly - 1 sets - 10 reps - Supine Transversus Abdominis Bracing with Pelvic Floor Contraction  - 2 x daily - 7 x weekly - 1 sets - 10 reps - 10sec  hold - Half Kneeling Hip Flexor Stretch  - 2 x daily - 7 x weekly - 1 sets - 3 reps - 30 sec  hold - Squat with Chair Touch  - 2 x daily - 7 x weekly - 1 sets - 10 reps - X Band Walk  - 2 x daily - 7 x weekly - 1 sets - 10 reps - Supine Hip Internal and External Rotation  - 2 x daily - 7 x weekly - 1 sets - 10 reps - Standing Hip Internal Rotation AAROM on Stool  - 2 x daily - 7 x weekly - 1 sets - 10 reps   Program notes:     PT  Short Term Goals -      PT SHORT TERM GOAL #1   Title The patient will be indep with HEP for hip stability, core stability, flexibility, and self mobilization.    Time 4    Period ACHIEVED   Target Date 05/25/22      PT SHORT TERM GOAL #2  Title The patient will report no pain at rest in sitting.    Time 4    Period PARTIALLY MET:  has to change positions after 5-10 minutes/ hip flexor tightens   Target Date 05/25/22      PT SHORT TERM GOAL #3   Title The patient will report reduced incidence of tingling into anterior distal LE.    Time 4    Period PARTIALLY MET-- less intensity, but same frequency   Target Date 05/25/22              PT Long Term Goals       PT LONG TERM GOAL #1   Title The patient will be indep with progression of HEP.    Time 8    Period Weeks    Target Date 06/24/22      PT LONG TERM GOAL #2   Title The patient will improve R hip flexor, hip abductor, and knee extensor strength to 5/5.    Time 8    Period Weeks    Target Date 06/24/22      PT LONG TERM GOAL #3   Title The patient will demo full squat with return to standing without c/o R leg weakness/instability.    Time 8    Period Weeks    Target Date 06/24/22      PT LONG TERM GOAL #4   Title The patient will report pain < or equal to 2/10.    Time 8    Period Weeks    Target Date 06/24/22              Plan     Clinical Impression Statement The patient partially met STGs.  He continues with ongoing sensation of "tightness", however strength and tolerance to activity is improving.   PT Frequency 2x / week    PT Duration 8 weeks    PT Treatment/Interventions ADLs/Self Care Home Management;Manual techniques;Patient/family education;Moist Heat;Electrical Stimulation;Dry needling;Therapeutic exercise;Therapeutic activities;Neuromuscular re-education;Passive range of motion;Taping;Traction    PT Next Visit Plan HEP progression (adding more strength), STM, DN, stretching, core  stability    Consulted and Agree with Plan of Care Patient               [place], PT, 06/06/2022, 11:57 AM

## 2022-06-13 ENCOUNTER — Ambulatory Visit: Payer: 59 | Admitting: Rehabilitative and Restorative Service Providers"

## 2022-06-13 ENCOUNTER — Encounter: Payer: Self-pay | Admitting: Rehabilitative and Restorative Service Providers"

## 2022-06-13 DIAGNOSIS — M545 Low back pain, unspecified: Secondary | ICD-10-CM

## 2022-06-13 DIAGNOSIS — R29898 Other symptoms and signs involving the musculoskeletal system: Secondary | ICD-10-CM | POA: Diagnosis not present

## 2022-06-13 NOTE — Therapy (Signed)
OUTPATIENT PHYSICAL THERAPY TREATMENT NOTE   Patient Name: Austin SUPPLE, MD MRN: [id number] DOB:[birth year], 36 y.o., male Today's Date: 06/13/2022  PCP: Aundria Mems, MD REFERRING PROVIDER: Aundria Mems, MD   PT End of Session - 06/13/22 1151     Visit Number 8    Number of Visits 16    Date for PT Re-Evaluation 06/24/22    Authorization Type U[employer]    PT Start Time 1148    PT Stop Time 1232    PT Time Calculation (min) 44 min    Activity Tolerance Patient tolerated treatment well              History reviewed. No pertinent past medical history. History reviewed. No pertinent surgical history. Patient Active Problem List   Diagnosis Date Noted   Nausea vomiting and diarrhea 03/21/2022   Viral syndrome 02/12/2022   Adult ADHD 01/10/2022   Degeneration of thoracolumbar intervertebral disc 01/20/2021   Annual physical exam 09/02/2020   Anxiety and depression 08/26/2020   Abnormal weight gain 07/28/2020    REFERRING DIAG: Lumbar Degenerative Disc Disease  THERAPY DIAG:  Acute right-sided low back pain without sciatica - Plan: PT plan of care cert/re-cert   Other symptoms and signs involving the musculoskeletal system - Plan: PT plan of care cert/re-cert    Rationale for Evaluation and Treatment Rehabilitation  PERTINENT HISTORY: The patient is known from our clinic from last year for low back therapy.  He was doing well until 3 months ago when he lifted a shop vac and twisted.  Pain has worsened and is at R SI radiating into gluts, piriformis, and to the knee. He has experienced 2 episodes of R knee buckling due to weakness.  He has hyporeflexia in patellar tendon.  PRECAUTIONS: Fall due to knee buckling  SUBJECTIVE: 06/13/22 - No pain over the weekend. 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 Noticed some sensation that the Rt LE was "giving away" when he was running to chase the girls. Overall  notes increased strength with R LE. Still getting regular sensation of "tightness" with mild discomfort.  PAIN:  06/13/22 Are you having pain? Yes: NPRS scale: 2-3/10 Pain location: Rt buttocks Pain description: aching; spasm;discomfort;sore Aggravating factors: end of day Relieving factors: rest, exercise    TODAY'S TREATMENT:  06/06/22 THEREX: Treadmill x 4 minutes @ 2.2 to 2.4 mph for warmup  Standing single leg reaching to mat surface x 3 reps  Wall squat progressed to sit<>stand squat x 10 reps with cues for posterior weight shift  Hip standing IR stretch with R LE supported on stool  Standing single leg stance R and L x 10-15 seconds each  Hip flexor stretch prone with one leg off edge of mat  Prone press ups x 10 reps with R SI/ mobilization with movement P>A grade II-III   MANUAL: STM and DN in R hip, R SI P>A grade II-III, STM R QL   Trigger Point Dry-Needling  Treatment instructions: Expect mild to moderate muscle soreness.  Patient Consent Given: Yes Education handout provided: Previously provided Muscles treated: R piriformis, deep hip R side Treatment response/outcome: palpable lengthening *glut med felt less painful/tender today, therefore did not use e-stim or DN glut med today      TODAY'S TREATMENT:  05/30/22  THEREX:    Treadmill 5 minutes 1.8 mph to 2.5 mph  Supine Bridges HEP  No pain  Prone hip extension HEP  No pain -  increased difficult with Lt vs Rt  Sidelying hip abduction HEP  Cues for hip extension  Quadriped  hEP  With overpressure into extension  Side plank HEP   Modified to bent knees to reduce pain.  Sit<>stand squats  HEP  Increased fatigue - no pain  Forward step ups HEP     4 part core   10 sec hold x 5  Supine   Hip flexor stretch  Half kneeling 30 sec x 3 reps    Hip flexor stretch  Thomas position supine 30 sec x 2 reps each side   pulling both knees to chest  Piriformis stretch   30 sec x 3 reps  Supine travell varying angles  of stretch   Hip adductor stretch  30 sec x 3 reps  Supine with strap  Self:    Foam roller    MANUAL:    Trigger point DN See below   STM R gluts  For pain, muscle release  Muscle energy    E-stim with DN 5 minutes   R gluts       Modalities:  Moist heat lumbar spine/buttocks x 10 min   Trigger Point Dry Needling, Manual Therapy   Treatment:  06/13/22 Initial or subsequent education regarding Trigger Point Dry Needling: Subsequent Did patient give consent to treatment with Trigger Point Dry Needling: Yes TPDN with skilled palpation and monitoring followed by STM to the following muscles: Rt glut medius, glut maximus, piriformis   Assessment:  Note tightness in the hip flexors/quads/piriformis and gluts Rt lower quarter. Tightness in the Rt adductors.   Myofacial ball release work Rt psoas pt prone and posterior hip in supine ~ 5 min each   Modalities:  MH Rt posterior hip   PATIENT EDUCATION: Education details: HEP Person educated: Patient Education method: Consulting civil engineer, Media planner, and Verbal cues Education comprehension: verbalized understanding and returned demonstration   HOME EXERCISE PROGRAM: Access Code: [email or web address] URL: [email or web address] Date: 06/06/2022 Prepared by: Rudell Cobb Exercises - Seated Table Hamstring Stretch  - 2 x daily - 7 x weekly - 1 sets - 2 reps - 30 seconds hold - Seated Figure 4 Piriformis Stretch  - 2 x daily - 7 x weekly - 1 sets - 3 reps - 30 seconds hold - Supine Piriformis Stretch with Leg Straight  - 2 x daily - 7 x weekly - 1 sets - 10 reps - Hip Flexor Stretch at Edge of Bed  - 2 x daily - 7 x weekly - 1 sets - 10 reps - Glute Max Release With [employer] Against Wall  - 2 x daily - 7 x weekly - 1 sets - 10 reps - Supine Transversus Abdominis Bracing with Pelvic Floor Contraction  - 2 x daily - 7 x weekly - 1 sets - 10 reps - 10sec  hold - Half Kneeling Hip Flexor Stretch  - 2 x daily - 7 x weekly - 1 sets -  3 reps - 30 sec  hold - Squat with Chair Touch  - 2 x daily - 7 x weekly - 1 sets - 10 reps - X Band Walk  - 2 x daily - 7 x weekly - 1 sets - 10 reps - Supine Hip Internal and External Rotation  - 2 x daily - 7 x weekly - 1 sets - 10 reps - Standing Hip Internal Rotation AAROM on Stool  - 2 x daily - 7 x weekly - 1 sets - 10 reps  Program notes:     PT Short Term Goals -      PT SHORT TERM GOAL #1   Title The patient will be indep with HEP for hip stability, core stability, flexibility, and self mobilization.    Time 4    Period ACHIEVED   Target Date 05/25/22      PT SHORT TERM GOAL #2   Title The patient will report no pain at rest in sitting.    Time 4    Period PARTIALLY MET:  has to change positions after 5-10 minutes/ hip flexor tightens   Target Date 05/25/22      PT SHORT TERM GOAL #3   Title The patient will report reduced incidence of tingling into anterior distal LE.    Time 4    Period PARTIALLY MET-- less intensity, but same frequency   Target Date 05/25/22              PT Long Term Goals       PT LONG TERM GOAL #1   Title The patient will be indep with progression of HEP.    Time 8    Period Weeks    Target Date 06/24/22      PT LONG TERM GOAL #2   Title The patient will improve R hip flexor, hip abductor, and knee extensor strength to 5/5.    Time 8    Period Weeks    Target Date 06/24/22      PT LONG TERM GOAL #3   Title The patient will demo full squat with return to standing without c/o R leg weakness/instability.    Time 8    Period Weeks    Target Date 06/24/22      PT LONG TERM GOAL #4   Title The patient will report pain < or equal to 2/10.    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  Note continued muscular tightness in the Rt posterior hip and buttock - glut min/med and piriformis. Good response to DN and manual work with release of muscular tightness post treatment.    PT Frequency 2x / week    PT Duration 8 weeks    PT Treatment/Interventions ADLs/Self Care Home Management;Manual techniques;Patient/family education;Moist Heat;Electrical Stimulation;Dry needling;Therapeutic exercise;Therapeutic activities;Neuromuscular re-education;Passive range of motion;Taping;Traction    PT Next Visit Plan HEP progression (adding more strength), STM, DN, stretching, core stability    Consulted and Agree with Plan of Care Patient               Everardo All, PT, MPH 06/13/2022, 12:36 PM

## 2022-06-20 ENCOUNTER — Ambulatory Visit: Payer: 59 | Admitting: Rehabilitative and Restorative Service Providers"

## 2022-06-20 DIAGNOSIS — M545 Low back pain, unspecified: Secondary | ICD-10-CM

## 2022-06-20 DIAGNOSIS — R29898 Other symptoms and signs involving the musculoskeletal system: Secondary | ICD-10-CM | POA: Diagnosis not present

## 2022-06-20 NOTE — Therapy (Signed)
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 No past surgical history on file. Patient Active Problem List   Diagnosis Date Noted   Nausea vomiting and diarrhea 03/21/2022   Viral syndrome 02/12/2022   Adult ADHD 01/10/2022   Degeneration of thoracolumbar intervertebral disc 01/20/2021   Annual physical exam 09/02/2020   Anxiety and depression 08/26/2020   Abnormal weight gain 07/28/2020    REFERRING DIAG: Lumbar Degenerative Disc Disease  THERAPY DIAG:  Acute right-sided low back pain without sciatica - Plan: PT plan of care cert/re-cert   Other symptoms and signs involving the musculoskeletal system - Plan: PT plan of care cert/re-cert    Rationale for Evaluation and Treatment Rehabilitation  PERTINENT HISTORY: The patient is known from our clinic from last year for low back therapy.  He was doing well until 3 months ago when he lifted a shop vac and twisted.  Pain has worsened and is at R SI radiating into gluts, piriformis, and to the knee. He has experienced 2 episodes of R knee buckling due to weakness.  He has hyporeflexia in patellar tendon.  PRECAUTIONS: Fall due to knee buckling  SUBJECTIVE: 06/20/22 - No pain just some soreness. 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 Noticed some sensation that the Rt LE was "giving away" when he was running to chase the girls. Overall notes increased strength with R LE. Still getting regular sensation of "tightness" with  mild discomfort.  PAIN:  06/20/22 Are you having pain? 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 Relieving factors: rest, exercise    TODAY'S TREATMENT:  06/06/22 THEREX: Treadmill x 4 minutes @ 2.2 to 2.4 mph for warmup  Standing single leg reaching to mat surface x 3 reps  Wall squat progressed to sit<>stand squat x 10 reps with cues for posterior weight shift  Hip standing IR stretch with R LE supported on stool  Standing single leg stance R and L x 10-15 seconds each  Hip flexor stretch prone with one leg off edge of mat  Prone press ups x 10 reps with R SI/ mobilization with movement P>A grade II-III   MANUAL: STM and DN in R hip, R SI P>A grade II-III, STM R QL   Trigger Point Dry-Needling  Treatment instructions: Expect mild to moderate muscle soreness.  Patient Consent Given: Yes Education handout provided: Previously provided Muscles treated: R piriformis, deep hip R side Treatment response/outcome: palpable lengthening *glut med felt less painful/tender today, therefore did not use e-stim or DN glut med today      TODAY'S TREATMENT:  05/30/22  THEREX:    Treadmill 5 minutes 1.8 mph to 2.5 mph  Supine Bridges HEP  No pain  Prone hip extension HEP  No pain - increased difficult with  Lt vs Rt  Sidelying hip abduction HEP  Cues for hip extension  Quadriped  hEP  With overpressure into extension  Side plank HEP   Modified to bent knees to reduce pain.  Sit<>stand squats  HEP  Increased fatigue - no pain  Forward step ups HEP     4 part core   10 sec hold x 5  Supine   Hip flexor stretch  Half kneeling 30 sec x 3 reps    Hip flexor stretch  Thomas position supine 30 sec x 2 reps each side   pulling both knees to chest  Piriformis stretch   30 sec x 3 reps  Supine travell varying angles of stretch   Hip adductor stretch   30 sec x 3 reps  Supine with strap  Self:    Foam roller    MANUAL:    Trigger point DN See below   STM R gluts  For pain, muscle release  Muscle energy    E-stim with DN 5 minutes   R gluts       Modalities:  Moist heat lumbar spine/buttocks x 10 min   Trigger Point Dry Needling, Manual Therapy   Treatment:  06/20/22 Initial or subsequent education regarding Trigger Point Dry Needling: Subsequent Did patient give consent to treatment with Trigger Point Dry Needling: Yes TPDN with skilled palpation and monitoring followed by STM to the following muscles: Rt glut medius, glut maximus, piriformis   Exercises: antirotation blue TB double x 10 each side   Assessment:  Note tightness in the hip flexors/quads/piriformis and gluts Rt lower quarter. Tightness in the Rt adductors.   06/20/22: Decreased tightness noted in the posterior Rt hip   Myofacial ball release work Rt psoas pt prone and posterior hip in supine ~ 5 min each   Modalities:  MH Rt posterior hip   PATIENT EDUCATION: Education details: HEP Person educated: Patient Education method: Consulting civil engineer, Media planner, and Verbal cues Education comprehension: verbalized understanding and returned demonstration   HOME EXERCISE PROGRAM:  Access Code: [email or web address] URL: [email or web address] Date: 06/20/2022 Prepared by: Gillermo Murdoch Exercises - Seated Table Hamstring Stretch  - 2 x daily - 7 x weekly - 1 sets - 2 reps - 30 seconds hold - Seated Figure 4 Piriformis Stretch  - 2 x daily - 7 x weekly - 1 sets - 3 reps - 30 seconds hold - Supine Piriformis Stretch with Leg Straight  - 2 x daily - 7 x weekly - 1 sets - 10 reps - Hip Flexor Stretch at Edge of Bed  - 2 x daily - 7 x weekly - 1 sets - 10 reps - Glute Max Release With [employer] Against Wall  - 2 x daily - 7 x weekly - 1 sets - 10 reps - Supine Transversus Abdominis Bracing with Pelvic Floor Contraction  - 2 x daily - 7 x weekly - 1 sets - 10 reps -  10sec  hold - Half Kneeling Hip Flexor Stretch  - 2 x daily - 7 x weekly - 1 sets - 3 reps - 30 sec  hold - Squat with Chair Touch  - 2 x daily - 7 x weekly - 1 sets - 10 reps - X Band Walk  - 2 x daily - 7 x weekly - 1 sets - 10 reps - Supine Hip Internal and External Rotation  - 2 x daily - 7 x weekly - 1 sets - 10 reps - Standing Hip Internal Rotation  AAROM on Stool  - 2 x daily - 7 x weekly - 1 sets - 10 reps - Anti-Rotation Lateral Stepping with Press  - 2 x daily - 7 x weekly - 1-2 sets - 10 reps - 2-3 sec  hold  Program notes:     PT Short Term Goals -      PT SHORT TERM GOAL #1   Title The patient will be indep with HEP for hip stability, core stability, flexibility, and self mobilization.    Time 4    Period ACHIEVED   Target Date 05/25/22      PT SHORT TERM GOAL #2   Title The patient will report no pain at rest in sitting.    Time 4    Period PARTIALLY MET:  has to change positions after 5-10 minutes/ hip flexor tightens   Target Date 05/25/22      PT SHORT TERM GOAL #3   Title The patient will report reduced incidence of tingling into anterior distal LE.    Time 4    Period PARTIALLY MET-- less intensity, but same frequency   Target Date 05/25/22              PT Long Term Goals       PT LONG TERM GOAL #1   Title The patient will be indep with progression of HEP.    Time 8    Period Weeks    Target Date 06/24/22      PT LONG TERM GOAL #2   Title The patient will improve R hip flexor, hip abductor, and knee extensor strength to 5/5.    Time 8    Period Weeks    Target Date 06/24/22      PT LONG TERM GOAL #3   Title The patient will demo full squat with return to standing without c/o R leg weakness/instability.    Time 8    Period Weeks    Target Date 06/24/22      PT LONG TERM GOAL #4   Title The patient will report pain < or equal to 2/10.    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 He continues to notice a sensation of weakness in the Rt LE. Note continued muscular tightness in the Rt posterior hip and buttock - glut min/med and piriformis. Good response to DN and manual work with release of muscular tightness post treatment. Added antirotation exercise with blue TB x 10 each side to help address rotational stability for tolerating softball    PT Frequency 2x / week    PT Duration 8 weeks    PT Treatment/Interventions ADLs/Self Care Home Management;Manual techniques;Patient/family education;Moist Heat;Electrical Stimulation;Dry needling;Therapeutic exercise;Therapeutic activities;Neuromuscular re-education;Passive range of motion;Taping;Traction    PT Next Visit Plan HEP progression (adding more strength), STM, DN, stretching, core stability - decrease visits to every other week    Consulted and Agree with Plan of Care Patient               Everardo All, PT, MPH 06/20/2022, 12:47 PM

## 2022-07-03 ENCOUNTER — Telehealth: Payer: Self-pay | Admitting: Sports Medicine

## 2022-07-03 DIAGNOSIS — M25551 Pain in right hip: Secondary | ICD-10-CM | POA: Insufficient documentation

## 2022-07-03 NOTE — Assessment & Plan Note (Addendum)
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 Symptoms are predominantly posterior buttock. I have injected his right sacroiliac joint and he did have some good but temporary relief. He tells me he does feel a popping type sensation, potentially coming from his SI joint versus posterior hip structures.   I would like Dr. Steward Drone to weigh in.

## 2022-07-03 NOTE — Telephone Encounter (Signed)
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 Symptoms are predominantly posterior buttock. I have injected his right sacroiliac joint and he did have some good but temporary relief. He tells me he does feel a popping type sensation, potentially coming from his SI joint versus posterior hip structures.   I would like Dr. Bokshan to weigh in. 

## 2022-07-04 ENCOUNTER — Ambulatory Visit: Payer: 59 | Attending: Sports Medicine | Admitting: Rehabilitative and Restorative Service Providers"

## 2022-07-04 ENCOUNTER — Encounter: Payer: Self-pay | Admitting: Rehabilitative and Restorative Service Providers"

## 2022-07-04 DIAGNOSIS — R29898 Other symptoms and signs involving the musculoskeletal system: Secondary | ICD-10-CM | POA: Diagnosis present

## 2022-07-04 DIAGNOSIS — M545 Low back pain, unspecified: Secondary | ICD-10-CM

## 2022-07-04 NOTE — Therapy (Addendum)
OUTPATIENT PHYSICAL THERAPY TREATMENT NOTE and [place] SUMMARY  PHYSICAL THERAPY DISCHARGE SUMMARY  Visits from Start of Care: 10  Current functional level related to goals / functional outcomes: See progress note for discharge status    Remaining deficits: Continued intermittent pain in hip    Education / Equipment: HEP    Patient agrees to discharge. Patient goals were partially met. Patient is being discharged due to being pleased with the current functional level. Jalacia Mattila P. 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 No pertinent past medical history. History reviewed. No pertinent surgical history. Patient Active Problem List   Diagnosis Date Noted   Right hip pain 07/03/2022   Nausea vomiting and diarrhea 03/21/2022   Viral syndrome 02/12/2022   Adult ADHD 01/10/2022   Degeneration of thoracolumbar intervertebral disc 01/20/2021   Annual physical exam 09/02/2020   Anxiety and depression 08/26/2020   Abnormal weight gain 07/28/2020    REFERRING DIAG: Lumbar Degenerative Disc Disease  THERAPY DIAG:  Acute right-sided low back pain without sciatica - Plan: PT plan of care cert/re-cert   Other symptoms and signs involving the musculoskeletal system - Plan: PT plan of care cert/re-cert    Rationale for Evaluation and Treatment Rehabilitation  PERTINENT HISTORY: The patient is known from our clinic from last year for low back therapy.  He was doing well until 3 months ago when he lifted a shop vac and twisted.  Pain has worsened and is at R SI radiating into gluts, piriformis, and to the knee. He has experienced 2 episodes of R knee buckling due to weakness.  He has hyporeflexia in  patellar tendon.  PRECAUTIONS: Fall due to knee buckling  SUBJECTIVE: 07/04/22 - No pain but has some soreness. 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 Noticed some sensation that the Rt LE was "giving away" when he was running but now much less and occurs after longer periods of time running.Now ascending steps without pain or sensation of weakness. Overall notes increased strength with R LE. Still getting less frequent sensation of "tightness" or discomfort. Confident in continuing with independent management of symptoms and HEP. Understands that he needs to continue strengthening.   PAIN:  07/04/22 Are you having pain? 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   07/04/22: Decreased tightness noted in the posterior Rt hip   Myofacial ball release work Rt psoas pt prone and posterior hip in supine ~ 5 min each   Modalities:  None today  PATIENT EDUCATION: Education details: HEP Person educated: Patient Education method:  Explanation, Demonstration, and Verbal cues Education comprehension: verbalized understanding and returned demonstration   HOME EXERCISE PROGRAM:  Access Code: [email or web address] URL: [email or web address] Date: 06/20/2022 Prepared by: Gillermo Murdoch Exercises - Seated  Table Hamstring Stretch  - 2 x daily - 7 x weekly - 1 sets - 2 reps - 30 seconds hold - Seated Figure 4 Piriformis Stretch  - 2 x daily - 7 x weekly - 1 sets - 3 reps - 30 seconds hold - Supine Piriformis Stretch with Leg Straight  - 2 x daily - 7 x weekly - 1 sets - 10 reps - Hip Flexor Stretch at Edge of Bed  - 2 x daily - 7 x weekly - 1 sets - 10 reps - Glute Max Release With [employer] Against Wall  - 2 x daily - 7 x weekly - 1 sets - 10 reps - Supine Transversus Abdominis Bracing with Pelvic Floor Contraction  - 2 x daily - 7 x weekly - 1 sets - 10 reps - 10sec  hold - Half Kneeling Hip Flexor Stretch  - 2 x daily - 7 x weekly - 1 sets - 3 reps - 30 sec  hold - Squat with Chair Touch  - 2 x daily - 7 x weekly - 1 sets - 10 reps - X Band Walk  - 2 x daily - 7 x weekly - 1 sets - 10 reps - Supine Hip Internal and External Rotation  - 2 x daily - 7 x weekly - 1 sets - 10 reps - Standing Hip Internal Rotation AAROM on Stool  - 2 x daily - 7 x weekly - 1 sets - 10 reps - Anti-Rotation Lateral Stepping with Press  - 2 x daily - 7 x weekly - 1-2 sets - 10 reps - 2-3 sec  hold  Program notes:     PT Short Term Goals -      PT SHORT TERM GOAL #1   Title The patient will be indep with HEP for hip stability, core stability, flexibility, and self mobilization.    Time 4    Period ACHIEVED   Target Date 05/25/22      PT SHORT TERM GOAL #2   Title The patient will report no pain at rest in sitting.    Time 4    Period Achieved   Target Date 05/25/22      PT SHORT TERM GOAL #3   Title The patient will report reduced incidence of tingling into anterior distal LE.    Time 4    Period Achieved   Target Date 05/25/22              PT Long Term Goals       PT LONG TERM GOAL #1   Title The patient will be indep with progression of HEP.    Time 8    Period Weeks    Target Date 06/24/22  Achieved     PT LONG TERM GOAL #2   Title The patient will improve R hip flexor, hip  abductor, and knee extensor strength to 5/5.    Time 8    Period Weeks    Target Date 06/24/22      PT LONG TERM GOAL #3   Title The patient will demo full squat with return to standing without c/o R leg weakness/instability.    Time 8    Period Weeks    Target Date 06/24/22      PT LONG  TERM GOAL #4   Title The patient will report pain < or equal to 2/10.    Time 8    Period Weeks    Target Date 06/24/22 Achieved             Plan     Clinical Impression Statement Improving with less tightness and soreness in the anterior and posterior hip area. Decreased tightness to palpation in the Rt sacral border with some continued tightness noted at posterior greater trochanter. 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  He continues to notice a sensation of weakness in the Rt LE with running at times but that has improved from baseline. No difficult with ascending stairs now. Note continued but improving muscular tightness in the Rt posterior hip and buttock - glut min/med and piriformis. Good response to DN and manual work with release of muscular tightness post treatment. Progressed antirotation exercises including shd flexion/ext, figure 8, circles CW/CCW and diagonals with blue TB x 5-10 each side to improve core stability and help address rotational stability   PT Frequency 2x / week    PT Duration 8 weeks    PT Treatment/Interventions ADLs/Self Care Home Management;Manual techniques;Patient/family education;Moist Heat;Electrical Stimulation;Dry needling;Therapeutic exercise;Therapeutic activities;Neuromuscular re-education;Passive range of motion;Taping;Traction    PT Next Visit Plan HEP progression (adding more strength), STM, DN, stretching, core stability - patient will call to schedule as needed - continue with HEP independently   Consulted and Agree with Plan of Care Patient               Everardo All, PT,  MPH 07/04/2022, 12:43 PM

## 2022-07-13 ENCOUNTER — Ambulatory Visit (INDEPENDENT_AMBULATORY_CARE_PROVIDER_SITE_OTHER): Payer: 59 | Admitting: Orthopaedic Surgery

## 2022-07-13 DIAGNOSIS — S73191A Other sprain of right hip, initial encounter: Secondary | ICD-10-CM | POA: Diagnosis not present

## 2022-07-13 MED ORDER — LIDOCAINE HCL 1 % IJ SOLN
4.0000 mL | INTRAMUSCULAR | Status: AC | PRN
  Administered 2022-07-13: 4 mL

## 2022-07-13 MED ORDER — TRIAMCINOLONE ACETONIDE 40 MG/ML IJ SUSP
80.0000 mg | INTRAMUSCULAR | Status: AC | PRN
  Administered 2022-07-13: 80 mg via INTRA_ARTICULAR

## 2022-07-13 NOTE — Progress Notes (Signed)
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  He does state that approximately 2 years ago he did have an incident where he felt a pop in the hip but this seemed to resolve on its own.  He has been receiving treatment and undergoing physical therapy on the right hip with limited improvement.  He did have an epidural injection performed as well which did not provide any relief longer than 2 days.  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 No past surgical history on file. Social History   Socioeconomic History   Marital status: Married    Spouse name: Not on file   Number of children: Not on file   Years of education: Not on file   Highest education level: Not on file  Occupational History   Not on file  Tobacco Use   Smoking status: Not on file   Smokeless tobacco: Not on file  Substance and Sexual Activity   Alcohol use: Not on file   Drug use: Not on file   Sexual activity: Not on file  Other Topics Concern   Not on file  Social History Narrative   Not on file   Social Determinants of Health   Financial Resource Strain: Not on file  Food Insecurity: Not on file  Transportation Needs: Not on file  Physical Activity: Not on file  Stress: Not on file  Social Connections: Not on file   No family history on file. Allergies  Allergen Reactions   Penicillins    Sulfonamide Derivatives    Current Outpatient Medications  Medication Sig Dispense Refill   amphetamine-dextroamphetamine (ADDERALL XR) 15 MG 24 hr capsule Take 1 capsule by mouth daily. 90 capsule 0    buPROPion (WELLBUTRIN XL) 150 MG 24 hr tablet Take 1 tablet (150 mg total) by mouth daily. 90 tablet 3   cyclobenzaprine (FLEXERIL) 10 MG tablet One half to one tab PO qHS, then increase gradually to one tab TID. 30 tablet 11   metoCLOPramide (REGLAN) 10 MG tablet 1 tab PO TID prn Nausea 30 tablet 3   ondansetron (ZOFRAN-ODT) 8 MG disintegrating tablet Take 1 tablet (8 mg total) by mouth every 8 (eight) hours as needed for nausea. 20 tablet 3   oxyCODONE-acetaminophen (PERCOCET) 10-325 MG tablet Take 1 tablet by mouth every 4 (four) hours as needed for pain. 30 tablet 0   predniSONE (STERAPRED UNI-PAK 48 TAB) 10 MG (48) TBPK tablet Take by mouth daily. 12-day taper pack, use as directed for taper 1 tablet 0   prochlorperazine (COMPAZINE) 10 MG tablet Take 1 tablet (10 mg total) by mouth every 6 (six) hours as needed for nausea or vomiting. 30 tablet 3   traMADol (ULTRAM) 50 MG tablet TAKE 1 TABLET (50 MG TOTAL) BY MOUTH EVERY 8 (EIGHT) HOURS AS NEEDED FOR MODERATE PAIN 60 tablet 2   WEGOVY 0.25 MG/0.5ML SOAJ Inject 0.25 mg into the skin once  a week. Use this dose for 1 month then increase to next higher dose. 2 mL 0   WEGOVY 0.5 MG/0.5ML SOAJ Inject 0.5 mg into the skin once a week. Use this dose for 1 month then increase to next higher dose. 2 mL 0   WEGOVY 1 MG/0.5ML SOAJ Inject 1 mg into the skin once a week. Use this dose for 1 month then increase to next higher dose. 2 mL 0   No current facility-administered medications for this visit.   Facility-Administered Medications Ordered in Other Visits  Medication Dose Route Frequency Provider Last Rate Last Admin   iohexol (OMNIPAQUE) 350 MG/ML injection 100 mL  100 mL Intravenous Once PRN Silverio Decamp, MD       No results found.  Review of Systems:   A ROS was performed including pertinent positives and negatives as documented in the HPI.  Physical Exam :   Constitutional: NAD and appears stated age Neurological: Alert and  oriented Psych: Appropriate affect and cooperative There were no vitals taken for this visit.   Comprehensive Musculoskeletal Exam:    Inspection Right Left  Skin No atrophy or gross abnormalities appreciated No atrophy or gross abnormalities appreciated  Palpation    Tenderness None None  Crepitus None None  Range of Motion    Flexion (passive) 120 120  Extension 30 30  IR 30 with pain 30  ER 45 45  Strength    Flexion  5/5 5/5  Extension 5/5 5/5  Special Tests    FABER Negative Negative  FADIR Positive Negative  ER Lag/Capsular Insufficiency Negative Negative  Instability Negative Negative  Sacroiliac pain Negative  Negative   Instability    Generalized Laxity No No  Neurologic    sciatic, femoral, obturator nerves intact to light sensation  Vascular/Lymphatic    DP pulse 2+ 2+  Lumbar Exam    Patient has symmetric lumbar range of motion with negative pain referral to hip     Imaging:   Xray (3 views right hip): There is a small pincer deformity otherwise normal  MRI (right hip): There does appear to be a labral tear involving the right hip in the anterior superior region  I personally reviewed and interpreted the radiographs.   Assessment:   36 y.o. male with a right hip labral tear which I believe is likely result of his previous injury 2 years prior.  At this time he is trialed physical therapy.  I do believe that a ultrasound-guided hip injection will be very helpful both therapeutically and diagnostically to see if this is a true underlying source of his pain.  We will plan to proceed with this.  He will send me a message in terms of how much relief he got from this in the upcoming weeks so that we can fully ascertain what additional interventions need to be performed if any.  Plan :    -Right hip ultrasound-guided injection performed with verbal consent obtained    Procedure Note  Patient: Austin Mayo, MD             Date of Birth: [birth year]            MRN: [id number]             Visit Date: 07/13/2022  Procedures: Visit Diagnoses: No diagnosis found.  Large Joint Inj: R hip joint on 07/13/2022 3:32 PM Indications: pain Details: 22 G 3.5 in needle, ultrasound-guided anterolateral approach  Arthrogram: No  Medications: 4  mL lidocaine 1 %; 80 mg triamcinolone acetonide 40 MG/ML Outcome: tolerated well, no immediate complications Procedure, treatment alternatives, risks and benefits explained, specific risks discussed. Consent was given by the patient. Immediately prior to procedure a time out was called to verify the correct patient, procedure, equipment, support staff and site/side marked as required. Patient was prepped and draped in the usual sterile fashion.          I personally saw and evaluated the patient, and participated in the management and treatment plan.  Vanetta Mulders, MD Attending Physician, Orthopedic Surgery  This document was dictated using Dragon voice recognition software. A reasonable attempt at proof reading has been made to minimize errors.

## 2022-07-17 ENCOUNTER — Other Ambulatory Visit: Payer: Self-pay | Admitting: Sports Medicine

## 2022-07-17 DIAGNOSIS — M5136 Other intervertebral disc degeneration, lumbar region: Secondary | ICD-10-CM

## 2022-07-20 ENCOUNTER — Other Ambulatory Visit: Payer: Self-pay | Admitting: Sports Medicine

## 2022-07-20 DIAGNOSIS — M5135 Other intervertebral disc degeneration, thoracolumbar region: Secondary | ICD-10-CM

## 2022-07-20 MED ORDER — GABAPENTIN 600 MG PO TABS: 600.0000 mg | ORAL_TABLET | Freq: Three times a day (TID) | ORAL | 3 refills | Status: DC

## 2022-09-18 ENCOUNTER — Other Ambulatory Visit: Payer: Self-pay | Admitting: Sports Medicine

## 2022-09-18 MED ORDER — AZITHROMYCIN 250 MG PO TABS: ORAL_TABLET | ORAL | 3 refills | Status: DC

## 2022-09-18 MED ORDER — DOXYCYCLINE HYCLATE 100 MG PO TABS: 100.0000 mg | ORAL_TABLET | Freq: Two times a day (BID) | ORAL | 3 refills | Status: AC

## 2022-09-18 MED ORDER — PREDNISONE 50 MG PO TABS: 50.0000 mg | ORAL_TABLET | Freq: Every day | ORAL | 3 refills | Status: DC

## 2022-09-24 ENCOUNTER — Other Ambulatory Visit: Payer: Self-pay | Admitting: Sports Medicine

## 2022-09-24 ENCOUNTER — Other Ambulatory Visit (HOSPITAL_COMMUNITY): Payer: Self-pay

## 2022-09-24 DIAGNOSIS — M5135 Other intervertebral disc degeneration, thoracolumbar region: Secondary | ICD-10-CM

## 2022-09-24 MED ORDER — GABAPENTIN 600 MG PO TABS
600.0000 mg | ORAL_TABLET | Freq: Three times a day (TID) | ORAL | 3 refills | Status: DC
  Filled 2022-09-24: qty 270, 90d supply, fill #0
  Filled 2023-01-05: qty 270, 90d supply, fill #1
  Filled 2023-04-08: qty 270, 90d supply, fill #2

## 2022-10-11 ENCOUNTER — Other Ambulatory Visit: Payer: Self-pay | Admitting: Sports Medicine

## 2022-10-11 DIAGNOSIS — B349 Viral infection, unspecified: Secondary | ICD-10-CM

## 2022-10-11 DIAGNOSIS — Z20828 Contact with and (suspected) exposure to other viral communicable diseases: Secondary | ICD-10-CM

## 2022-10-11 MED ORDER — OSELTAMIVIR PHOSPHATE 75 MG PO CAPS: 75.0000 mg | ORAL_CAPSULE | Freq: Every day | ORAL | 0 refills | Status: DC

## 2022-11-05 ENCOUNTER — Other Ambulatory Visit: Payer: Self-pay | Admitting: Sports Medicine

## 2022-11-05 DIAGNOSIS — F909 Attention-deficit hyperactivity disorder, unspecified type: Secondary | ICD-10-CM

## 2022-11-05 MED ORDER — AMPHETAMINE-DEXTROAMPHET ER 15 MG PO CP24
15.0000 mg | ORAL_CAPSULE | Freq: Every day | ORAL | 0 refills | Status: DC
  Filled 2022-11-05: qty 90, 90d supply, fill #0

## 2022-11-06 ENCOUNTER — Other Ambulatory Visit (HOSPITAL_COMMUNITY): Payer: Self-pay

## 2022-11-09 ENCOUNTER — Ambulatory Visit: Payer: Commercial Managed Care - PPO | Admitting: Sports Medicine

## 2022-11-09 ENCOUNTER — Ambulatory Visit (INDEPENDENT_AMBULATORY_CARE_PROVIDER_SITE_OTHER): Payer: Commercial Managed Care - PPO

## 2022-11-09 DIAGNOSIS — M51369 Other intervertebral disc degeneration, lumbar region without mention of lumbar back pain or lower extremity pain: Secondary | ICD-10-CM

## 2022-11-09 DIAGNOSIS — M25551 Pain in right hip: Secondary | ICD-10-CM

## 2022-11-09 DIAGNOSIS — M5136 Other intervertebral disc degeneration, lumbar region: Secondary | ICD-10-CM | POA: Diagnosis not present

## 2022-11-09 MED ORDER — TRAMADOL HCL 50 MG PO TABS: 50.0000 mg | ORAL_TABLET | Freq: Three times a day (TID) | ORAL | 3 refills | Status: DC | PRN

## 2022-11-09 NOTE — Assessment & Plan Note (Addendum)
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 did a hip joint injection that seem to provide good relief. He is having recurrence of pain so we did a repeat right hip joint injection today. Going to [employer] next week, adding some tramadol for breakthrough pain. Return to see me as needed.

## 2022-11-09 NOTE — Addendum Note (Signed)
Addended by: Silverio Decamp on: 11/09/2022 11:40 AM   Modules accepted: Orders

## 2022-11-09 NOTE — Progress Notes (Addendum)
    Procedures performed today:    Procedure: Real-time Ultrasound Guided injection of the right hip joint Device: Samsung HS60  Verbal informed consent obtained.  Time-out conducted.  Noted no overlying erythema, induration, or other signs of local infection.  Skin prepped in a sterile fashion.  Local anesthesia: Topical Ethyl chloride.  With sterile technique and under real time ultrasound guidance: Trace femoral head spurring noted, 1 cc Kenalog 40, 2 cc lidocaine, 2 cc bupivacaine injected easily Completed without difficulty  Advised to call if fevers/chills, erythema, induration, drainage, or persistent bleeding.  Images permanently stored and available for review in PACS.  Impression: Technically successful ultrasound guided injection.  Independent interpretation of notes and tests performed by another provider:   None.  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 did a hip joint injection that seem to provide good relief. He is having recurrence of pain so we did a repeat right hip joint injection today. Going to [employer] next week, adding some tramadol for breakthrough pain. Return to see me as needed.    ____________________________________________ [name], M.D., ABFM., CAQSM., AME. Primary Care and Sports Medicine [facility]  Adjunct Professor of [employer] [facility] of Medicine  Risk manager

## 2022-12-07 ENCOUNTER — Telehealth: Payer: Self-pay | Admitting: Sports Medicine

## 2022-12-07 DIAGNOSIS — M791 Myalgia, unspecified site: Secondary | ICD-10-CM | POA: Insufficient documentation

## 2022-12-07 NOTE — Assessment & Plan Note (Signed)
Significant myalgias, degenerative disc disease, patient would benefit from daily interferential treatment.

## 2022-12-07 NOTE — Telephone Encounter (Signed)
    Procedures performed today:    None.  Independent interpretation of notes and tests performed by another provider:   None.  Brief History, Exam, Impression, and Recommendations:    Myalgia Significant myalgias, degenerative disc disease, patient would benefit from daily interferential treatment.    ____________________________________________ [name], M.D., ABFM., CAQSM., AME. Primary Care and Sports Medicine [facility]  Adjunct Professor of [employer] [facility] of Medicine  Risk manager

## 2023-01-05 ENCOUNTER — Other Ambulatory Visit (HOSPITAL_COMMUNITY): Payer: Self-pay

## 2023-02-01 ENCOUNTER — Other Ambulatory Visit (HOSPITAL_BASED_OUTPATIENT_CLINIC_OR_DEPARTMENT_OTHER): Payer: Self-pay

## 2023-02-23 ENCOUNTER — Other Ambulatory Visit: Payer: Self-pay | Admitting: Sports Medicine

## 2023-02-23 DIAGNOSIS — F909 Attention-deficit hyperactivity disorder, unspecified type: Secondary | ICD-10-CM

## 2023-02-25 ENCOUNTER — Other Ambulatory Visit: Payer: Self-pay

## 2023-02-25 MED ORDER — AMPHETAMINE-DEXTROAMPHET ER 15 MG PO CP24
15.0000 mg | ORAL_CAPSULE | Freq: Every day | ORAL | 0 refills | Status: AC
  Filled 2023-02-25: qty 90, 90d supply, fill #0

## 2023-02-26 ENCOUNTER — Other Ambulatory Visit: Payer: Self-pay

## 2023-02-26 ENCOUNTER — Other Ambulatory Visit (HOSPITAL_COMMUNITY): Payer: Self-pay

## 2023-02-28 ENCOUNTER — Encounter: Payer: Self-pay | Admitting: Sports Medicine

## 2023-04-09 ENCOUNTER — Other Ambulatory Visit (HOSPITAL_COMMUNITY): Payer: Self-pay

## 2023-06-17 ENCOUNTER — Telehealth: Payer: Self-pay | Admitting: Sports Medicine

## 2023-06-17 DIAGNOSIS — S0502XA Injury of conjunctiva and corneal abrasion without foreign body, left eye, initial encounter: Secondary | ICD-10-CM

## 2023-06-17 DIAGNOSIS — S0500XA Injury of conjunctiva and corneal abrasion without foreign body, unspecified eye, initial encounter: Secondary | ICD-10-CM | POA: Insufficient documentation

## 2023-06-17 MED ORDER — GATIFLOXACIN 0.5 % OP SOLN: 1.0000 [drp] | Freq: Four times a day (QID) | OPHTHALMIC | 0 refills | Status: DC

## 2023-06-17 NOTE — Telephone Encounter (Signed)
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  He does wear contact lenses and as this may have happened outdoors there is a risk of vegetation/organic matter in the eyes so we will use a fluoroquinolone. Adding topical gatifloxacin.

## 2023-06-17 NOTE — Assessment & Plan Note (Signed)
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  He does wear contact lenses and as this may have happened outdoors there is a risk of vegetation/organic matter in the eyes so we will use a fluoroquinolone. Adding topical gatifloxacin.

## 2023-07-14 DIAGNOSIS — M791 Myalgia, unspecified site: Secondary | ICD-10-CM | POA: Diagnosis not present

## 2023-11-25 ENCOUNTER — Other Ambulatory Visit (INDEPENDENT_AMBULATORY_CARE_PROVIDER_SITE_OTHER): Payer: Commercial Managed Care - PPO

## 2023-11-25 ENCOUNTER — Ambulatory Visit (INDEPENDENT_AMBULATORY_CARE_PROVIDER_SITE_OTHER): Payer: Commercial Managed Care - PPO | Admitting: Sports Medicine

## 2023-11-25 DIAGNOSIS — M25551 Pain in right hip: Secondary | ICD-10-CM | POA: Diagnosis not present

## 2023-11-25 NOTE — Assessment & Plan Note (Signed)
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 Steward Drone did seem to provide good relief. We did a repeat right hip joint injection with ultrasound guidance back in January 2024 and he has done well, we can refill tramadol as needed.

## 2023-11-25 NOTE — Progress Notes (Signed)
    Procedures performed today:    Procedure: Real-time Ultrasound Guided injection of the right hip joint Device: Samsung HS60  Verbal informed consent obtained.  Time-out conducted.  Noted no overlying erythema, induration, or other signs of local infection.  Skin prepped in a sterile fashion.  Local anesthesia: Topical Ethyl chloride.  With sterile technique and under real time ultrasound guidance: Trace femoral head spurring noted, 1 cc Kenalog 40, 2 cc lidocaine, 2 cc bupivacaine injected easily Completed without difficulty  Advised to call if fevers/chills, erythema, induration, drainage, or persistent bleeding.  Images permanently stored and available for review in PACS.  Impression: Technically successful ultrasound guided injection.  Independent interpretation of notes and tests performed by another provider:   None.  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 Steward Drone did seem to provide good relief. We did a repeat right hip joint injection with ultrasound guidance back in January 2024 and he has done well, we can refill tramadol as needed.  Chronic process with exacerbation and pharmacologic intervention  ____________________________________________ [name], M.D., ABFM., CAQSM., AME. Primary Care and Sports Medicine [facility]  Adjunct Professor of Family Medicine  [place] [employer] [facility] [employer]  Restaurant manager, fast food

## 2023-11-26 DIAGNOSIS — M25551 Pain in right hip: Secondary | ICD-10-CM | POA: Diagnosis not present

## 2023-11-26 MED ORDER — TRIAMCINOLONE ACETONIDE 40 MG/ML IJ SUSP
40.0000 mg | Freq: Once | INTRAMUSCULAR | Status: AC
  Administered 2023-11-26: 40 mg via INTRAMUSCULAR

## 2023-11-26 NOTE — Addendum Note (Signed)
Addended by: Carren Rang A on: 11/26/2023 01:34 PM   Modules accepted: Orders

## 2023-11-26 NOTE — Code Documentation (Signed)
 done

## 2023-12-19 ENCOUNTER — Telehealth: Payer: Self-pay | Admitting: Sports Medicine

## 2023-12-19 DIAGNOSIS — M5135 Other intervertebral disc degeneration, thoracolumbar region: Secondary | ICD-10-CM

## 2023-12-19 DIAGNOSIS — M51369 Other intervertebral disc degeneration, lumbar region without mention of lumbar back pain or lower extremity pain: Secondary | ICD-10-CM

## 2023-12-19 DIAGNOSIS — M25551 Pain in right hip: Secondary | ICD-10-CM

## 2023-12-19 MED ORDER — PREDNISONE 10 MG (48) PO TBPK: ORAL_TABLET | Freq: Every day | ORAL | 0 refills | Status: DC

## 2023-12-19 MED ORDER — GABAPENTIN 600 MG PO TABS: 600.0000 mg | ORAL_TABLET | Freq: Three times a day (TID) | ORAL | 3 refills | Status: AC

## 2023-12-19 MED ORDER — TRAMADOL HCL 50 MG PO TABS: 50.0000 mg | ORAL_TABLET | Freq: Three times a day (TID) | ORAL | 3 refills | Status: AC | PRN

## 2023-12-19 NOTE — Telephone Encounter (Signed)
 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 We will send him in a 12-day prednisone taper, refill his gabapentin, 600 mg 3 times daily. He will continue his therapy and we can consider epidural if insufficient improvement.

## 2023-12-19 NOTE — Assessment & Plan Note (Signed)
 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 MRI did show multilevel disc desiccation with some mild right L4-L5 nerve root impingement, he had a right L4-L5 interlaminar epidural. He has had a sacroiliac joint injection on the right for diagnostic and therapeutic purposes with good improvement. Now having an increasing pain, per his request we will do a 12-day prednisone taper, refill tramadol, gabapentin.

## 2024-02-03 ENCOUNTER — Telehealth: Payer: Self-pay | Admitting: Sports Medicine

## 2024-02-03 ENCOUNTER — Other Ambulatory Visit: Payer: Self-pay | Admitting: Sports Medicine

## 2024-02-03 ENCOUNTER — Other Ambulatory Visit (HOSPITAL_COMMUNITY): Payer: Self-pay

## 2024-02-03 DIAGNOSIS — E782 Mixed hyperlipidemia: Secondary | ICD-10-CM | POA: Insufficient documentation

## 2024-02-03 LAB — LAB REPORT - SCANNED: EGFR: 103

## 2024-02-03 MED ORDER — ROSUVASTATIN CALCIUM 10 MG PO TABS
10.0000 mg | ORAL_TABLET | Freq: Every day | ORAL | 3 refills | Status: AC
  Filled 2024-02-03: qty 90, 90d supply, fill #0

## 2024-02-03 MED ORDER — ROSUVASTATIN CALCIUM 10 MG PO TABS: 10.0000 mg | ORAL_TABLET | Freq: Every day | ORAL | 3 refills | Status: DC

## 2024-02-03 NOTE — Telephone Encounter (Signed)
 Noted lipids elevated, adding crestor, recheck in 3 months.

## 2024-03-13 ENCOUNTER — Other Ambulatory Visit (INDEPENDENT_AMBULATORY_CARE_PROVIDER_SITE_OTHER)

## 2024-03-13 ENCOUNTER — Encounter: Payer: Self-pay | Admitting: Sports Medicine

## 2024-03-13 ENCOUNTER — Ambulatory Visit (INDEPENDENT_AMBULATORY_CARE_PROVIDER_SITE_OTHER): Admitting: Sports Medicine

## 2024-03-13 DIAGNOSIS — M25551 Pain in right hip: Secondary | ICD-10-CM

## 2024-03-13 MED ORDER — TRIAMCINOLONE ACETONIDE 40 MG/ML IJ SUSP
40.0000 mg | Freq: Once | INTRAMUSCULAR | Status: AC
  Administered 2024-03-13: 40 mg via INTRAMUSCULAR

## 2024-03-13 NOTE — Progress Notes (Signed)
    Procedures performed today:    Procedure: Real-time Ultrasound Guided injection of the right hip joint Device: Samsung HS60  Verbal informed consent obtained.  Time-out conducted.  Noted no overlying erythema, induration, or other signs of local infection.  Skin prepped in a sterile fashion.  Local anesthesia: Topical Ethyl chloride.  With sterile technique and under real time ultrasound guidance: Mild arthritic changes noted, 1 cc Kenalog  40, 2 cc lidocaine , 2 cc bupivacaine injected easily Completed without difficulty  Advised to call if fevers/chills, erythema, induration, drainage, or persistent bleeding.  Images were not able to be stored for this injection. Impression: Technically successful ultrasound guided injection.  Independent interpretation of notes and tests performed by another provider:   None.  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 Hermina Loosen, the hip joint injection seems to provide relief. We did a repeat right hip joint injection with ultrasound guidance back in January 2024 he did well, we are refilling tramadol  as needed. Today we did another right hip joint injection, he can return to see me as needed.    ____________________________________________ Joselyn Nicely. Sandy Crumb, M.D., ABFM., CAQSM., AME. Primary Care and Sports Medicine [place] [facility]  Adjunct Professor of [facility] Medicine  [employer]  Restaurant manager, fast food

## 2024-03-13 NOTE — Assessment & Plan Note (Signed)
 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 Hermina Loosen, the hip joint injection seems to provide relief. We did a repeat right hip joint injection with ultrasound guidance back in January 2024 he did well, we are refilling tramadol  as needed. Today we did another right hip joint injection, he can return to see me as needed.

## 2024-03-17 ENCOUNTER — Other Ambulatory Visit: Payer: Self-pay | Admitting: Sports Medicine

## 2024-03-17 MED ORDER — PREDNISONE 10 MG (48) PO TBPK: ORAL_TABLET | Freq: Every day | ORAL | 0 refills | Status: DC

## 2024-04-28 ENCOUNTER — Telehealth: Payer: Self-pay | Admitting: Sports Medicine

## 2024-04-28 DIAGNOSIS — M5135 Other intervertebral disc degeneration, thoracolumbar region: Secondary | ICD-10-CM

## 2024-04-28 MED ORDER — OXYCODONE-ACETAMINOPHEN 10-325 MG PO TABS
1.0000 | ORAL_TABLET | ORAL | 0 refills | Status: DC | PRN
Start: 2024-04-28 — End: 2024-05-21

## 2024-04-28 NOTE — Telephone Encounter (Signed)
 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

## 2024-05-20 ENCOUNTER — Emergency Department (HOSPITAL_COMMUNITY): Admitting: Anesthesiology

## 2024-05-20 ENCOUNTER — Encounter (HOSPITAL_BASED_OUTPATIENT_CLINIC_OR_DEPARTMENT_OTHER): Payer: Self-pay

## 2024-05-20 ENCOUNTER — Other Ambulatory Visit: Payer: Self-pay

## 2024-05-20 ENCOUNTER — Emergency Department (HOSPITAL_BASED_OUTPATIENT_CLINIC_OR_DEPARTMENT_OTHER)

## 2024-05-20 ENCOUNTER — Inpatient Hospital Stay: Admit: 2024-05-20 | Admitting: Surgery

## 2024-05-20 ENCOUNTER — Observation Stay (HOSPITAL_BASED_OUTPATIENT_CLINIC_OR_DEPARTMENT_OTHER)
Admission: EM | Admit: 2024-05-20 | Discharge: 2024-05-21 | Disposition: A | Discharge status: Home / Self Care | Attending: Surgery | Admitting: Surgery

## 2024-05-20 ENCOUNTER — Encounter (HOSPITAL_COMMUNITY)
Admission: EM | Disposition: A | Discharge status: Home / Self Care | Payer: Self-pay | Source: Home / Self Care | Attending: Emergency Medicine

## 2024-05-20 DIAGNOSIS — E6689 Other obesity not elsewhere classified: Secondary | ICD-10-CM | POA: Diagnosis not present

## 2024-05-20 DIAGNOSIS — Z9049 Acquired absence of other specified parts of digestive tract: Secondary | ICD-10-CM

## 2024-05-20 DIAGNOSIS — F418 Other specified anxiety disorders: Secondary | ICD-10-CM | POA: Diagnosis not present

## 2024-05-20 DIAGNOSIS — R9431 Abnormal electrocardiogram [ECG] [EKG]: Secondary | ICD-10-CM | POA: Diagnosis not present

## 2024-05-20 DIAGNOSIS — K3580 Unspecified acute appendicitis: Secondary | ICD-10-CM | POA: Diagnosis not present

## 2024-05-20 DIAGNOSIS — R109 Unspecified abdominal pain: Secondary | ICD-10-CM | POA: Diagnosis not present

## 2024-05-20 DIAGNOSIS — Z6841 Body Mass Index (BMI) 40.0 and over, adult: Secondary | ICD-10-CM

## 2024-05-20 DIAGNOSIS — R1013 Epigastric pain: Secondary | ICD-10-CM | POA: Diagnosis not present

## 2024-05-20 DIAGNOSIS — K352 Acute appendicitis with generalized peritonitis, without perforation or abscess: Secondary | ICD-10-CM | POA: Diagnosis not present

## 2024-05-20 DIAGNOSIS — K5730 Diverticulosis of large intestine without perforation or abscess without bleeding: Secondary | ICD-10-CM | POA: Diagnosis not present

## 2024-05-20 HISTORY — DX: Dorsalgia, unspecified: M54.9

## 2024-05-20 HISTORY — PX: LAPAROSCOPIC APPENDECTOMY: SHX408

## 2024-05-20 HISTORY — DX: Gastro-esophageal reflux disease without esophagitis: K21.9

## 2024-05-20 LAB — CBC WITH DIFFERENTIAL/PLATELET
Abs Immature Granulocytes: 0.06 K/uL (ref 0.00–0.07)
Basophils Absolute: 0.1 K/uL (ref 0.0–0.1)
Basophils Relative: 0 %
Eosinophils Absolute: 0.1 K/uL (ref 0.0–0.5)
Eosinophils Relative: 0 %
HCT: 41.1 % (ref 39.0–52.0)
Hemoglobin: 14.1 g/dL (ref 13.0–17.0)
Immature Granulocytes: 0 %
Lymphocytes Relative: 9 %
Lymphs Abs: 1.2 K/uL (ref 0.7–4.0)
MCH: 27.3 pg (ref 26.0–34.0)
MCHC: 34.3 g/dL (ref 30.0–36.0)
MCV: 79.7 fL — ABNORMAL LOW (ref 80.0–100.0)
Monocytes Absolute: 0.7 K/uL (ref 0.1–1.0)
Monocytes Relative: 5 %
Neutro Abs: 11.6 K/uL — ABNORMAL HIGH (ref 1.7–7.7)
Neutrophils Relative %: 86 %
Platelets: 274 K/uL (ref 150–400)
RBC: 5.16 MIL/uL (ref 4.22–5.81)
RDW: 13.2 % (ref 11.5–15.5)
WBC: 13.7 K/uL — ABNORMAL HIGH (ref 4.0–10.5)
nRBC: 0 % (ref 0.0–0.2)

## 2024-05-20 LAB — COMPREHENSIVE METABOLIC PANEL WITH GFR
ALT: 31 U/L (ref 0–44)
AST: 21 U/L (ref 15–41)
Albumin: 4.4 g/dL (ref 3.5–5.0)
Alkaline Phosphatase: 45 U/L (ref 38–126)
Anion gap: 10 (ref 5–15)
BUN: 21 mg/dL — ABNORMAL HIGH (ref 6–20)
CO2: 25 mmol/L (ref 22–32)
Calcium: 9.3 mg/dL (ref 8.9–10.3)
Chloride: 103 mmol/L (ref 98–111)
Creatinine, Ser: 0.86 mg/dL (ref 0.61–1.24)
GFR, Estimated: >60 mL/min (ref >60)
Glucose, Bld: 120 mg/dL — ABNORMAL HIGH (ref 70–99)
Potassium: 3.9 mmol/L (ref 3.5–5.1)
Sodium: 139 mmol/L (ref 135–145)
Total Bilirubin: 0.4 mg/dL (ref 0.0–1.2)
Total Protein: 7 g/dL (ref 6.5–8.1)

## 2024-05-20 LAB — LIPASE, BLOOD: Lipase: 19 U/L (ref 11–51)

## 2024-05-20 LAB — TROPONIN T, HIGH SENSITIVITY: Troponin T High Sensitivity: <15 ng/L (ref <19)

## 2024-05-20 SURGERY — APPENDECTOMY, LAPAROSCOPIC
Anesthesia: General

## 2024-05-20 MED ORDER — MIDAZOLAM HCL 5 MG/5ML IJ SOLN
INTRAMUSCULAR | Status: DC | PRN
  Administered 2024-05-20: 2 mg via INTRAVENOUS

## 2024-05-20 MED ORDER — FENTANYL CITRATE PF 50 MCG/ML IJ SOSY: 50.0000 ug | PREFILLED_SYRINGE | Freq: Once | INTRAMUSCULAR | Status: AC

## 2024-05-20 MED ORDER — ONDANSETRON HCL 4 MG/2ML IJ SOLN: 4.0000 mg | Freq: Four times a day (QID) | INTRAMUSCULAR | Status: DC | PRN

## 2024-05-20 MED ORDER — OXYCODONE HCL 5 MG PO TABS
5.0000 mg | ORAL_TABLET | Freq: Four times a day (QID) | ORAL | Status: DC | PRN
  Administered 2024-05-21: 5 mg via ORAL
  Filled 2024-05-20: qty 1

## 2024-05-20 MED ORDER — BUPIVACAINE-EPINEPHRINE 0.25% -1:200000 IJ SOLN
INTRAMUSCULAR | Status: DC | PRN
  Administered 2024-05-20: 30 mL

## 2024-05-20 MED ORDER — METOPROLOL TARTRATE 5 MG/5ML IV SOLN: 5.0000 mg | Freq: Four times a day (QID) | INTRAVENOUS | Status: DC | PRN

## 2024-05-20 MED ORDER — ONDANSETRON 4 MG PO TBDP: 4.0000 mg | ORAL_TABLET | Freq: Four times a day (QID) | ORAL | Status: DC | PRN

## 2024-05-20 MED ORDER — SUGAMMADEX SODIUM 200 MG/2ML IV SOLN
INTRAVENOUS | Status: AC
  Filled 2024-05-20: qty 4

## 2024-05-20 MED ORDER — ACETAMINOPHEN 500 MG PO TABS
1000.0000 mg | ORAL_TABLET | Freq: Once | ORAL | Status: AC
  Administered 2024-05-20: 1000 mg via ORAL
  Filled 2024-05-20: qty 2

## 2024-05-20 MED ORDER — CEFTRIAXONE SODIUM 2 G IJ SOLR
INTRAMUSCULAR | Status: AC
  Filled 2024-05-20: qty 20

## 2024-05-20 MED ORDER — DIPHENHYDRAMINE HCL 50 MG/ML IJ SOLN: 25.0000 mg | Freq: Four times a day (QID) | INTRAMUSCULAR | Status: DC | PRN

## 2024-05-20 MED ORDER — MORPHINE SULFATE (PF) 2 MG/ML IV SOLN: 2.0000 mg | INTRAVENOUS | Status: DC | PRN

## 2024-05-20 MED ORDER — PANTOPRAZOLE SODIUM 40 MG PO TBEC
40.0000 mg | DELAYED_RELEASE_TABLET | Freq: Every day | ORAL | Status: DC
  Administered 2024-05-20: 40 mg via ORAL
  Filled 2024-05-20: qty 1

## 2024-05-20 MED ORDER — CIPROFLOXACIN IN D5W 400 MG/200ML IV SOLN
400.0000 mg | Freq: Once | INTRAVENOUS | Status: AC
  Administered 2024-05-20: 400 mg via INTRAVENOUS
  Filled 2024-05-20: qty 200

## 2024-05-20 MED ORDER — METRONIDAZOLE 500 MG/100ML IV SOLN
500.0000 mg | Freq: Two times a day (BID) | INTRAVENOUS | Status: DC
  Administered 2024-05-21: 500 mg via INTRAVENOUS
  Filled 2024-05-20 (×2): qty 100

## 2024-05-20 MED ORDER — SUGAMMADEX SODIUM 200 MG/2ML IV SOLN
INTRAVENOUS | Status: DC | PRN
  Administered 2024-05-20: 400 mg via INTRAVENOUS

## 2024-05-20 MED ORDER — SODIUM CHLORIDE 0.9 % IV BOLUS
1000.0000 mL | Freq: Once | INTRAVENOUS | Status: AC
  Administered 2024-05-20: 1000 mL via INTRAVENOUS

## 2024-05-20 MED ORDER — LACTATED RINGERS IV SOLN: INTRAVENOUS | Status: DC

## 2024-05-20 MED ORDER — GABAPENTIN 300 MG PO CAPS
600.0000 mg | ORAL_CAPSULE | Freq: Three times a day (TID) | ORAL | Status: DC
  Administered 2024-05-20 – 2024-05-21 (×2): 600 mg via ORAL
  Filled 2024-05-20 (×2): qty 2

## 2024-05-20 MED ORDER — BUPIVACAINE-EPINEPHRINE (PF) 0.25% -1:200000 IJ SOLN
INTRAMUSCULAR | Status: AC
  Filled 2024-05-20: qty 30

## 2024-05-20 MED ORDER — HYDROMORPHONE HCL 1 MG/ML IJ SOLN: 0.5000 mg | INTRAMUSCULAR | Status: DC | PRN

## 2024-05-20 MED ORDER — PROPOFOL 10 MG/ML IV BOLUS
INTRAVENOUS | Status: AC
  Filled 2024-05-20: qty 20

## 2024-05-20 MED ORDER — FENTANYL CITRATE (PF) 100 MCG/2ML IJ SOLN
INTRAMUSCULAR | Status: AC
Start: 2024-05-20 — End: 2024-05-20
  Filled 2024-05-20: qty 2

## 2024-05-20 MED ORDER — MORPHINE SULFATE (PF) 4 MG/ML IV SOLN
4.0000 mg | Freq: Once | INTRAVENOUS | Status: AC
  Administered 2024-05-20: 4 mg via INTRAVENOUS
  Filled 2024-05-20: qty 1

## 2024-05-20 MED ORDER — PROCHLORPERAZINE EDISYLATE 10 MG/2ML IJ SOLN: 5.0000 mg | Freq: Four times a day (QID) | INTRAMUSCULAR | Status: DC | PRN

## 2024-05-20 MED ORDER — METRONIDAZOLE 500 MG/100ML IV SOLN
500.0000 mg | Freq: Once | INTRAVENOUS | Status: AC
  Administered 2024-05-20: 500 mg via INTRAVENOUS
  Filled 2024-05-20: qty 100

## 2024-05-20 MED ORDER — FENTANYL CITRATE (PF) 100 MCG/2ML IJ SOLN
INTRAMUSCULAR | Status: DC | PRN
  Administered 2024-05-20: 50 ug via INTRAVENOUS
  Administered 2024-05-20: 100 ug via INTRAVENOUS

## 2024-05-20 MED ORDER — BISACODYL 10 MG RE SUPP: 10.0000 mg | Freq: Every day | RECTAL | Status: DC | PRN

## 2024-05-20 MED ORDER — SODIUM CHLORIDE 0.9 % IV SOLN
2.0000 g | INTRAVENOUS | Status: DC
  Administered 2024-05-20: 2 g via INTRAVENOUS

## 2024-05-20 MED ORDER — SIMETHICONE 80 MG PO CHEW: 40.0000 mg | CHEWABLE_TABLET | Freq: Four times a day (QID) | ORAL | Status: DC | PRN

## 2024-05-20 MED ORDER — ONDANSETRON HCL 4 MG/2ML IJ SOLN
INTRAMUSCULAR | Status: DC | PRN
  Administered 2024-05-20: 4 mg via INTRAVENOUS

## 2024-05-20 MED ORDER — HYDRALAZINE HCL 20 MG/ML IJ SOLN: 10.0000 mg | INTRAMUSCULAR | Status: DC | PRN

## 2024-05-20 MED ORDER — FENTANYL CITRATE (PF) 100 MCG/2ML IJ SOLN
INTRAMUSCULAR | Status: AC
  Filled 2024-05-20: qty 2

## 2024-05-20 MED ORDER — FENTANYL CITRATE PF 50 MCG/ML IJ SOSY
PREFILLED_SYRINGE | INTRAMUSCULAR | Status: AC
  Administered 2024-05-20: 50 ug via INTRAVENOUS
  Filled 2024-05-20: qty 1

## 2024-05-20 MED ORDER — DROPERIDOL 2.5 MG/ML IJ SOLN: 0.6250 mg | Freq: Once | INTRAMUSCULAR | Status: DC | PRN

## 2024-05-20 MED ORDER — ACETAMINOPHEN 500 MG PO TABS
1000.0000 mg | ORAL_TABLET | Freq: Four times a day (QID) | ORAL | Status: DC
  Administered 2024-05-21 (×2): 1000 mg via ORAL
  Filled 2024-05-20 (×2): qty 2

## 2024-05-20 MED ORDER — LIDOCAINE HCL (CARDIAC) PF 100 MG/5ML IV SOSY
PREFILLED_SYRINGE | INTRAVENOUS | Status: DC | PRN
  Administered 2024-05-20: 100 mg via INTRAVENOUS

## 2024-05-20 MED ORDER — PROPOFOL 500 MG/50ML IV EMUL
INTRAVENOUS | Status: DC | PRN
  Administered 2024-05-20: 50 ug/kg/min via INTRAVENOUS

## 2024-05-20 MED ORDER — LACTATED RINGERS IR SOLN
Status: DC | PRN
  Administered 2024-05-20: 1000 mL

## 2024-05-20 MED ORDER — TRAMADOL HCL 50 MG PO TABS
50.0000 mg | ORAL_TABLET | Freq: Four times a day (QID) | ORAL | Status: DC | PRN
  Administered 2024-05-20 – 2024-05-21 (×2): 50 mg via ORAL
  Filled 2024-05-20 (×2): qty 1

## 2024-05-20 MED ORDER — OXYCODONE HCL 5 MG PO TABS: 5.0000 mg | ORAL_TABLET | Freq: Once | ORAL | Status: DC | PRN

## 2024-05-20 MED ORDER — CHLORHEXIDINE GLUCONATE 0.12 % MT SOLN
15.0000 mL | Freq: Once | OROMUCOSAL | Status: AC
  Administered 2024-05-20: 15 mL via OROMUCOSAL

## 2024-05-20 MED ORDER — PROPOFOL 10 MG/ML IV BOLUS
INTRAVENOUS | Status: DC | PRN
  Administered 2024-05-20: 200 mg via INTRAVENOUS

## 2024-05-20 MED ORDER — ONDANSETRON HCL 4 MG/2ML IJ SOLN
INTRAMUSCULAR | Status: AC
  Filled 2024-05-20: qty 2

## 2024-05-20 MED ORDER — IOHEXOL 300 MG/ML  SOLN
100.0000 mL | Freq: Once | INTRAMUSCULAR | Status: AC | PRN
  Administered 2024-05-20: 120 mL via INTRAVENOUS

## 2024-05-20 MED ORDER — DIPHENHYDRAMINE HCL 25 MG PO CAPS
25.0000 mg | ORAL_CAPSULE | Freq: Four times a day (QID) | ORAL | Status: DC | PRN
Start: 2024-05-20 — End: 2024-05-21

## 2024-05-20 MED ORDER — ROCURONIUM BROMIDE 100 MG/10ML IV SOLN
INTRAVENOUS | Status: DC | PRN
  Administered 2024-05-20: 20 mg via INTRAVENOUS
  Administered 2024-05-20: 70 mg via INTRAVENOUS
  Administered 2024-05-20: 10 mg via INTRAVENOUS

## 2024-05-20 MED ORDER — DOCUSATE SODIUM 100 MG PO CAPS
100.0000 mg | ORAL_CAPSULE | Freq: Two times a day (BID) | ORAL | Status: DC
  Administered 2024-05-20 – 2024-05-21 (×2): 100 mg via ORAL
  Filled 2024-05-20 (×2): qty 1

## 2024-05-20 MED ORDER — FENTANYL CITRATE PF 50 MCG/ML IJ SOSY
PREFILLED_SYRINGE | INTRAMUSCULAR | Status: AC
  Filled 2024-05-20: qty 2

## 2024-05-20 MED ORDER — PROCHLORPERAZINE EDISYLATE 10 MG/2ML IJ SOLN
10.0000 mg | Freq: Once | INTRAMUSCULAR | Status: AC
  Administered 2024-05-20: 10 mg via INTRAVENOUS
  Filled 2024-05-20: qty 2

## 2024-05-20 MED ORDER — FENTANYL CITRATE PF 50 MCG/ML IJ SOSY
25.0000 ug | PREFILLED_SYRINGE | INTRAMUSCULAR | Status: DC | PRN
  Administered 2024-05-20: 50 ug via INTRAVENOUS

## 2024-05-20 MED ORDER — SODIUM CHLORIDE 0.9 % IV SOLN: INTRAVENOUS | Status: DC

## 2024-05-20 MED ORDER — FENTANYL CITRATE PF 50 MCG/ML IJ SOSY
50.0000 ug | PREFILLED_SYRINGE | Freq: Once | INTRAMUSCULAR | Status: AC
  Administered 2024-05-20: 50 ug via INTRAVENOUS
  Filled 2024-05-20: qty 1

## 2024-05-20 MED ORDER — DEXAMETHASONE SODIUM PHOSPHATE 10 MG/ML IJ SOLN
INTRAMUSCULAR | Status: AC
  Filled 2024-05-20: qty 1

## 2024-05-20 MED ORDER — OXYCODONE HCL 5 MG/5ML PO SOLN: 5.0000 mg | Freq: Once | ORAL | Status: DC | PRN

## 2024-05-20 MED ORDER — PROCHLORPERAZINE MALEATE 10 MG PO TABS: 10.0000 mg | ORAL_TABLET | Freq: Four times a day (QID) | ORAL | Status: DC | PRN

## 2024-05-20 MED ORDER — DIPHENHYDRAMINE HCL 50 MG/ML IJ SOLN
12.5000 mg | Freq: Once | INTRAMUSCULAR | Status: AC
  Administered 2024-05-20: 12.5 mg via INTRAVENOUS
  Filled 2024-05-20: qty 1

## 2024-05-20 MED ORDER — DEXAMETHASONE SODIUM PHOSPHATE 10 MG/ML IJ SOLN
INTRAMUSCULAR | Status: DC | PRN
Start: 2024-05-20 — End: 2024-05-20
  Administered 2024-05-20: 8 mg via INTRAVENOUS

## 2024-05-20 MED ORDER — METHOCARBAMOL 500 MG PO TABS: 500.0000 mg | ORAL_TABLET | Freq: Four times a day (QID) | ORAL | Status: DC | PRN

## 2024-05-20 MED ORDER — 0.9 % SODIUM CHLORIDE (POUR BTL) OPTIME
TOPICAL | Status: DC | PRN
  Administered 2024-05-20: 1000 mL

## 2024-05-20 MED ORDER — MIDAZOLAM HCL 2 MG/2ML IJ SOLN
INTRAMUSCULAR | Status: AC
Start: 2024-05-20 — End: 2024-05-20
  Filled 2024-05-20: qty 2

## 2024-05-20 SURGICAL SUPPLY — 36 items
BAG COUNTER SPONGE SURGICOUNT (BAG) IMPLANT
CHLORAPREP W/TINT 26 (MISCELLANEOUS) ×1 IMPLANT
COVER SURGICAL LIGHT HANDLE (MISCELLANEOUS) ×1 IMPLANT
CUTTER FLEX LINEAR 45M (STAPLE) IMPLANT
DERMABOND ADVANCED .7 DNX12 (GAUZE/BANDAGES/DRESSINGS) ×1 IMPLANT
DRAIN CHANNEL 19F RND (DRAIN) IMPLANT
ELECT REM PT RETURN 15FT ADLT (MISCELLANEOUS) ×1 IMPLANT
ENDOLOOP SUT PDS II 0 18 (SUTURE) IMPLANT
EVACUATOR SILICONE 100CC (DRAIN) IMPLANT
GLOVE BIO SURGEON STRL SZ 6 (GLOVE) ×1 IMPLANT
GLOVE INDICATOR 6.5 STRL GRN (GLOVE) ×1 IMPLANT
GOWN STRL REUS W/ TWL LRG LVL3 (GOWN DISPOSABLE) ×1 IMPLANT
GOWN STRL REUS W/ TWL XL LVL3 (GOWN DISPOSABLE) IMPLANT
GRASPER SUT TROCAR 14GX15 (MISCELLANEOUS) IMPLANT
IRRIGATION SUCT STRKRFLW 2 WTP (MISCELLANEOUS) ×1 IMPLANT
KIT BASIN OR (CUSTOM PROCEDURE TRAY) ×1 IMPLANT
KIT TURNOVER KIT A (KITS) ×1 IMPLANT
NDL INSUFFLATION 14GA 120MM (NEEDLE) ×1 IMPLANT
NEEDLE INSUFFLATION 14GA 120MM (NEEDLE) ×1 IMPLANT
RELOAD STAPLE 45 2.5 WHT GRN (ENDOMECHANICALS) IMPLANT
RELOAD STAPLE 45 3.5 BLU ETS (ENDOMECHANICALS) IMPLANT
SCISSORS LAP 5X45 EPIX DISP (ENDOMECHANICALS) IMPLANT
SET TUBE SMOKE EVAC HIGH FLOW (TUBING) ×1 IMPLANT
SHEARS HARMONIC 36 ACE (MISCELLANEOUS) ×1 IMPLANT
SHEARS HARMONIC 45 ACE (MISCELLANEOUS) IMPLANT
SLEEVE Z-THREAD 5X100MM (TROCAR) ×1 IMPLANT
SPIKE FLUID TRANSFER (MISCELLANEOUS) ×1 IMPLANT
SUT ETHILON 2 0 PS N (SUTURE) IMPLANT
SUT MNCRL AB 4-0 PS2 18 (SUTURE) ×1 IMPLANT
SUT VICRYL 0 UR6 27IN ABS (SUTURE) IMPLANT
SYSTEM BAG RETRIEVAL 10MM (BASKET) ×1 IMPLANT
TOWEL OR 17X26 10 PK STRL BLUE (TOWEL DISPOSABLE) ×1 IMPLANT
TRAY FOLEY MTR SLVR 14FR STAT (SET/KITS/TRAYS/PACK) IMPLANT
TRAY LAPAROSCOPIC (CUSTOM PROCEDURE TRAY) ×1 IMPLANT
TROCAR ADV FIXATION 12X100MM (TROCAR) ×1 IMPLANT
TROCAR Z-THREAD OPTICAL 5X100M (TROCAR) ×1 IMPLANT

## 2024-05-20 NOTE — ED Triage Notes (Signed)
 Pt reports upper abdominal pain this last night into this morning. Denies vomiting. ED provider at bedside for triage. Took zofran  and tramadol  at home. Denies chest pain. Also states that he took some meds for reflux.

## 2024-05-20 NOTE — ED Provider Notes (Signed)
 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   Patient here with epigastric abdominal pain.  Started since last night.  Prior gallbladder removal.  He has had some nausea feeling.  Some loose stool.  Has some chronic back pain and hip pain.  Denies any chest pain weakness numbness tingling.  He states Zofran  and tramadol  have not helped.  He denies any fevers or chills.  No chest pain or shortness of breath.  Nothing makes it worse or better.  He has concern for pancreatitis.  The history is provided by the patient.       Prior to Admission medications   Medication Sig Start Date End Date Taking? Authorizing Provider  amphetamine -dextroamphetamine  (ADDERALL  XR) 15 MG 24 hr capsule Take 1 capsule by mouth daily. 02/25/23   Curtis Debby PARAS, MD  gabapentin  (NEURONTIN ) 600 MG tablet Take 1 tablet (600 mg total) by mouth 3 (three) times daily. 12/19/23   Curtis Debby PARAS, MD  gatifloxacin  (ZYMAXID ) 0.5 % SOLN Place 1 drop into the left eye 4 (four) times daily. 06/17/23   Curtis Debby PARAS, MD  oxyCODONE -acetaminophen  (PERCOCET) 10-325 MG tablet Take 1 tablet by mouth every 4 (four) hours as needed for pain. 04/28/24   Curtis Debby PARAS, MD  predniSONE  (STERAPRED UNI-PAK 48 TAB) 10 MG (48) TBPK tablet Take by mouth daily. 12-day taper pack, use as directed for taper Patient not taking: Reported on 05/20/2024 03/17/24   Curtis Debby PARAS, MD  rosuvastatin  (CRESTOR ) 10 MG tablet Take 1 tablet (10 mg total) by mouth daily. 02/03/24   Curtis Debby PARAS, MD  traMADol  (ULTRAM ) 50 MG tablet Take 1 tablet (50 mg total) by mouth every 8 (eight) hours as needed for moderate pain (pain score 4-6). 12/19/23   Curtis Debby PARAS, MD    Allergies: Penicillins and Sulfonamide derivatives    Review of  Systems  Updated Vital Signs BP (!) 142/89 (BP Location: Right Arm)   Pulse 73   Temp 97.6 F (36.4 C) (Oral)   Resp 18   Ht 6' (1.829 m)   Wt (!) 172.4 kg   SpO2 98%   BMI 51.54 kg/m   Physical Exam Vitals and nursing note reviewed.  Constitutional:      General: He is not in acute distress.    Appearance: He is well-developed. He is not ill-appearing.  HENT:     Head: Normocephalic and atraumatic.     Mouth/Throat:     Mouth: Mucous membranes are moist.  Eyes:     Extraocular Movements: Extraocular movements intact.     Conjunctiva/sclera: Conjunctivae normal.     Pupils: Pupils are equal, round, and reactive to light.  Cardiovascular:     Rate and Rhythm: Normal rate and regular rhythm.     Heart sounds: Normal heart sounds. No murmur heard. Pulmonary:     Effort: Pulmonary effort is normal. No respiratory distress.     Breath sounds: Normal breath sounds.  Abdominal:     General: Abdomen is flat.     Palpations: Abdomen is soft.     Tenderness: There is abdominal tenderness in the epigastric area.     Hernia: No hernia is present.  Musculoskeletal:        General: No swelling.     Cervical back: Neck supple.  Skin:    General: Skin is warm and dry.     Capillary Refill: Capillary refill takes less than 2 seconds.  Neurological:     General: No focal deficit present.     Mental Status: He is alert.  Psychiatric:        Mood and Affect: Mood normal.     (all labs ordered are listed, but only abnormal results are displayed) Labs Reviewed  CBC WITH DIFFERENTIAL/PLATELET - Abnormal; Notable for the following components:      Result Value   WBC 13.7 (*)    MCV 79.7 (*)    Neutro Abs 11.6 (*)    All other components within normal limits  COMPREHENSIVE METABOLIC PANEL WITH GFR - Abnormal; Notable for the following components:   Glucose, Bld 120 (*)    BUN 21 (*)    All other components within normal limits  LIPASE, BLOOD  TROPONIN T, HIGH SENSITIVITY     EKG: EKG Interpretation Date/Time:  Wednesday May 20 2024 09:24:25 EDT Ventricular Rate:  71 PR Interval:  154 QRS Duration:  102 QT Interval:  393 QTC Calculation: 428 R Axis:   50  Text Interpretation: Sinus rhythm Confirmed by Ruthe Cornet [phone]) on 05/20/2024 9:29:00 AM  Radiology: CT ABDOMEN PELVIS W CONTRAST Result Date: 05/20/2024 CLINICAL DATA:  38 year old male with acute abdominal pain. EXAM: CT ABDOMEN AND PELVIS WITH CONTRAST TECHNIQUE: Multidetector CT imaging of the abdomen and pelvis was performed using the standard protocol following bolus administration of intravenous contrast. RADIATION DOSE REDUCTION: This exam was performed according to the departmental dose-optimization program which includes automated exposure control, adjustment of the mA and/or kV according to patient size and/or use of iterative reconstruction technique. CONTRAST:  OMNIPAQUE  IOHEXOL  300 MG/ML  SOLN COMPARISON:  CT Abdomen and Pelvis 923. FINDINGS: Lower chest: Negative; minor lung base atelectasis. Hepatobiliary: Chronic cholecystectomy.  Negative liver. Pancreas: Negative. Spleen: Negative. Adrenals/Urinary Tract: Normal adrenal glands. Symmetric and normal kidneys. Diminutive ureters and bladder. Incidental pelvic phleboliths. Stomach/Bowel: Occasional large bowel diverticula (proximal transverse colon). Largely decompressed large bowel in the abdomen and pelvis. Enlarged and inflamed retrocecal appendix. Appendix: Location: Retrocecal, coronal image 92 Diameter: 18 mm Appendicolith: Positive, coronal image 100 Mucosal hyper-enhancement: Positive Extraluminal gas: Negative Periappendiceal collection: No organized or drainable fluid collection. Peri appendiceal inflammation and trace layering fluid in the right gutter there. Decompressed terminal ileum. Negative small bowel. Stomach and duodenum also appear negative. No pneumoperitoneum. No other free fluid in the abdomen. Vascular/Lymphatic:  Suboptimal intravascular contrast. Portal venous system appears to be patent. Normal caliber abdominal aorta. No atherosclerosis or lymphadenopathy identified. Reproductive: Negative. Other: No pelvis free fluid. Musculoskeletal: No acute osseous abnormality identified. IMPRESSION: 1. Acute Appendicitis, with evidence of underlying appendicolith. Regional inflammation but no perforation or drainable fluid collection. 2. No other acute or inflammatory process identified in the abdomen or pelvis. Electronically Signed   By: VEAR Hurst M.D.   On: 05/20/2024 10:53     Procedures   Medications Ordered in the ED  ciprofloxacin  (CIPRO ) IVPB 400 mg (400 mg Intravenous New Bag/Given 05/20/24 1105)    And  metroNIDAZOLE  (FLAGYL ) IVPB 500 mg (has no administration in time range)  sodium chloride  0.9 % bolus 1,000 mL (1,000 mLs Intravenous New Bag/Given 05/20/24 0941)  prochlorperazine  (COMPAZINE ) injection 10 mg (10 mg Intravenous Given 05/20/24 0942)  diphenhydrAMINE  (BENADRYL ) injection 12.5 mg (12.5 mg Intravenous Given 05/20/24 0941)  fentaNYL  (SUBLIMAZE ) injection 50 mcg (50 mcg Intravenous Given 05/20/24  9050)  iohexol  (OMNIPAQUE ) 300 MG/ML solution 100 mL (120 mLs Intravenous Contrast Given 05/20/24 1027)                                    Medical Decision Making Amount and/or Complexity of Data Reviewed Labs: ordered. Radiology: ordered.  Risk Prescription drug management.   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  Normal vitals.  No fever.  History of chronic back pain chronic right hip pain.  He had his gallbladder removed in the past.  Differential diagnosis includes pancreatitis colitis less likely bowel obstruction.  I do not have any concern clinically for dissection ACS or PE.  Will check EKG check basic labs CBC CMP lipase urinalysis and get CT scan abdomen pelvis.  This seems unlikely to be appendicitis.  Will give IV fluids IV Compazine  and Benadryl  and  reevaluate.  Lab work per my review interpretation is significant for a white count of 13.7 but otherwise troponin normal.  No significant leukocytosis anemia or electrolyte abnormality.  Lipase normal.  CT scan positive for acute appendicitis with evidence of underlying appendix.  Regional inflammation but no perforation or drainable fluid collection.  Overall we will start some IV antibiotics.  Will talk with general surgery team to arrange for transport to [facility] or [facility] for likely surgery today.  Talked with Vertell Pringle with general surgery team.  They will accept him to [facility] preop this afternoon for surgery.  Dr. Signe accepts the patient in transfer.  This chart was dictated using voice recognition software.  Despite best efforts to proofread,  errors can occur which can change the documentation meaning.      Final diagnoses:  Acute appendicitis, unspecified acute appendicitis type    ED Discharge Orders     None          Ruthe Cornet, DO 05/20/24 1156

## 2024-05-20 NOTE — H&P (Incomplete)
 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    Requesting MD: Dr. 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 Patient reports he began having abdominal pain yesterday located in his epigastrium that has *** since moved to his RLQ. Associated nausea without vomiting. No fever, constipation, diarrhea, or urinary symptoms. He underwent workup in the ED and imaging was concerning for Acute Appendicitis with Peri appendiceal inflammation and trace layering fluid in the right gutter. He does have evidence of an underlying appendicolith.  No organized or drainable fluid collection. WBC 13.7. He was transferred to [facility] for general surgery evaluation.   Past Medical History: GERD Prior Abdominal Surgeries: Cholecystectomy Blood Thinners: None Last PO intake: *** Last Colonoscopy: None on file Allergies: PCN, Sulfa  ROS: ROS As above, see hpi  History reviewed. No pertinent family history.  Past Medical History:  Diagnosis Date   Back pain    Gastric reflux     History reviewed. No pertinent surgical history.  Social History:  reports that he has never smoked. He has never used smokeless tobacco. No history on file for alcohol use and drug use.  Allergies:  Allergies  Allergen Reactions   Penicillins Hives and Dermatitis   Sulfonamide Derivatives Hives and Rash    (Not in a hospital admission)    Physical Exam: Blood pressure 137/81, pulse 65, temperature 97.6 F (36.4 C), temperature source Oral, resp. rate 18, height 6' (1.829 m), weight (!) 172.4 kg, SpO2 96%. General: pleasant, WD/WN male who is laying in bed in NAD HEENT: head is normocephalic, atraumatic.  Sclera are non-icteric.  Heart: regular, rate, and rhythm.   Lungs: CTAB, no wheezes, rhonchi, or rales noted.  Respiratory effort nonlabored Abd: *** Soft, ND, RLQ ttp, +BS. No masses, hernias, or  organomegaly MS: no BUE or BLE edema Skin: warm and dry  Psych: A&Ox4 with an appropriate affect*** Neuro: normal speech, thought process intact, moves all extremities, gait not assessed***   Results for orders placed or performed during the hospital encounter of 05/20/24 (from the past 48 hours)  CBC with Differential     Status: Abnormal   Collection Time: 05/20/24  9:39 AM  Result Value Ref Range   WBC 13.7 (H) 4.0 - 10.5 K/uL   RBC 5.16 4.22 - 5.81 MIL/uL   Hemoglobin 14.1 13.0 - 17.0 g/dL   HCT 58.8 60.9 - 47.9 %   MCV 79.7 (L) 80.0 - 100.0 fL   MCH 27.3 26.0 - 34.0 pg   MCHC 34.3 30.0 - 36.0 g/dL   RDW 86.7 88.4 - 84.4 %   Platelets 274 150 - 400 K/uL   nRBC 0.0 0.0 - 0.2 %   Neutrophils Relative % 86 %   Neutro Abs 11.6 (H) 1.7 - 7.7 K/uL   Lymphocytes Relative 9 %   Lymphs Abs 1.2 0.7 - 4.0 K/uL   Monocytes Relative 5 %   Monocytes Absolute 0.7 0.1 - 1.0 K/uL   Eosinophils Relative 0 %   Eosinophils Absolute 0.1 0.0 - 0.5 K/uL   Basophils Relative 0 %   Basophils Absolute 0.1 0.0 - 0.1 K/uL   Immature Granulocytes 0 %   Abs Immature Granulocytes 0.06 0.00 - 0.07 K/uL    Comment: Performed at [facility], [address] [place], [place] [address]  Comprehensive metabolic panel  Status: Abnormal   Collection Time: 05/20/24  9:39 AM  Result Value Ref Range   Sodium 139 135 - 145 mmol/L   Potassium 3.9 3.5 - 5.1 mmol/L   Chloride 103 98 - 111 mmol/L   CO2 25 22 - 32 mmol/L   Glucose, Bld 120 (H) 70 - 99 mg/dL    Comment: Glucose reference range applies only to samples taken after fasting for at least 8 hours.   BUN 21 (H) 6 - 20 mg/dL   Creatinine, Ser 9.13 0.61 - 1.24 mg/dL   Calcium  9.3 8.9 - 10.3 mg/dL   Total Protein 7.0 6.5 - 8.1 g/dL   Albumin 4.4 3.5 - 5.0 g/dL   AST 21 15 - 41 U/L   ALT 31 0 - 44 U/L   Alkaline Phosphatase 45 38 - 126 U/L   Total Bilirubin 0.4 0.0 - 1.2 mg/dL   GFR, Estimated >39 >39 mL/min    Comment:  (NOTE) Calculated using the CKD-EPI Creatinine Equation (2021)    Anion gap 10 5 - 15    Comment: Performed at [facility], [address] [place], [place] [address]  Lipase, blood     Status: None   Collection Time: 05/20/24  9:39 AM  Result Value Ref Range   Lipase 19 11 - 51 U/L    Comment: Performed at [facility], [address] [place], [place] [address]  Troponin T, High Sensitivity     Status: None   Collection Time: 05/20/24  9:45 AM  Result Value Ref Range   Troponin T High Sensitivity <15 <19 ng/L    Comment: (NOTE) Biotin concentrations > 1000 ng/mL falsely decrease TnT results.  Serial cardiac troponin measurements are suggested.  Refer to the Links section for chest pain algorithms and additional  guidance. Performed at [facility], [address] [place], [place] [address]    CT ABDOMEN PELVIS W CONTRAST Result Date: 05/20/2024 CLINICAL DATA:  38 year old male with acute abdominal pain. EXAM: CT ABDOMEN AND PELVIS WITH CONTRAST TECHNIQUE: Multidetector CT imaging of the abdomen and pelvis was performed using the standard protocol following bolus administration of intravenous contrast. RADIATION DOSE REDUCTION: This exam was performed according to the departmental dose-optimization program which includes automated exposure control, adjustment of the mA and/or kV according to patient size and/or use of iterative reconstruction technique. CONTRAST:  OMNIPAQUE  IOHEXOL  300 MG/ML  SOLN COMPARISON:  CT Abdomen and Pelvis 923. FINDINGS: Lower chest: Negative; minor lung base atelectasis. Hepatobiliary: Chronic cholecystectomy.  Negative liver. Pancreas: Negative. Spleen: Negative. Adrenals/Urinary Tract: Normal adrenal glands. Symmetric and normal kidneys. Diminutive ureters and bladder. Incidental pelvic phleboliths. Stomach/Bowel: Occasional large bowel diverticula (proximal transverse colon). Largely decompressed large bowel in the  abdomen and pelvis. Enlarged and inflamed retrocecal appendix. Appendix: Location: Retrocecal, coronal image 92 Diameter: 18 mm Appendicolith: Positive, coronal image 100 Mucosal hyper-enhancement: Positive Extraluminal gas: Negative Periappendiceal collection: No organized or drainable fluid collection. Peri appendiceal inflammation and trace layering fluid in the right gutter there. Decompressed terminal ileum. Negative small bowel. Stomach and duodenum also appear negative. No pneumoperitoneum. No other free fluid in the abdomen. Vascular/Lymphatic: Suboptimal intravascular contrast. Portal venous system appears to be patent. Normal caliber abdominal aorta. No atherosclerosis or lymphadenopathy identified. Reproductive: Negative. Other: No pelvis free fluid. Musculoskeletal: No acute osseous abnormality identified. IMPRESSION: 1. Acute Appendicitis, with evidence of underlying appendicolith. Regional inflammation but no perforation or drainable fluid collection. 2.  No other acute or inflammatory process identified in the abdomen or pelvis. Electronically Signed   By: VEAR Hurst M.D.   On: 05/20/2024 10:53    Anti-infectives (From admission, onward)    Start     Dose/Rate Route Frequency Ordered Stop   05/20/24 1100  ciprofloxacin  (CIPRO ) IVPB 400 mg       Placed in And Linked Group   400 mg 200 mL/hr over 60 Minutes Intravenous  Once 05/20/24 1057 05/20/24 1205   05/20/24 1100  metroNIDAZOLE  (FLAGYL ) IVPB 500 mg       Placed in And Linked Group   500 mg 100 mL/hr over 60 Minutes Intravenous  Once 05/20/24 1057 05/20/24 1314       Assessment/Plan Acute Appendicitis  History, exam and imaging consistent with acute appendicitis. No evidence of perforation or drainable abscess on CT. He does have an appendicolith. Discussed operative vs non-operative intervention.  I have explained the procedure, risks, and aftercare of Laparoscopic Appendectomy.  Risks include but are not limited to anesthesia  (MI, CVA, death, aspiration, prolonged intubation), bleeding, infection, wound problems, hernia, injury to surrounding structures (viscus, nerves, blood vessels, ureter), need for conversion to open procedure or ileocecectomy, post operative ileus or abscess, stump leak, stump appendicitis and increased risk of DVT/PE.  He seems to understand and agrees to proceed with surgery. Keep NPO. Start IV abx. Possible d/c from PACU. If requires admission postoperatively, will plan admission to observation.   FEN - NPO VTE - SCDs ID - Cipro /Flagyl  (PCN allergy)  I reviewed nursing notes, ED provider notes, last 24 h vitals and pain scores, last 48 h intake and output, last 24 h labs and trends, and last 24 h imaging results.   [facility] Surgery 05/20/2024, 1:57 PM Please see Amion for pager number during day hours 7:00am-4:30pm

## 2024-05-20 NOTE — Op Note (Signed)
 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 Antibiotics were administered. SCD's were applied. General endotracheal anesthesia was initiated and a formal time-out was performed. Foley catheter was inserted which is removed at the end of the case. The abdomen was prepped and draped in the usual sterile fashion and the abdomen was entered using an optical entry at the left upper quadrant and insufflated to 15 mmHg. The abdomen was inspected and there is no evidence of injury from our entry.  Thin omental adhesions are noted in the right upper quadrant and umbilical region.  Under direct visualization, a supraumbilical 5 mm trocar, low midline 5 mm trocar, and a left lower quadrant 12 mm trocar were introduced under direct visualization following infiltration with local. The patient was then placed in Trendelenburg and rotated to the left and the small bowel was reflected cephalad. The appendix is found to be completely retrocecal and acutely inflamed with a suppurative rind.  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  This limited the working room.  This did require an extra right upper quadrant 5 mm trocar and Dr. Ned was kind enough to scrub in to help with retraction which was necessary in order to afford appropriate visualization and safe dissection.  The lateral attachments of the terminal ileum and cecum were carefully dissected with the harmonic  scalpel and blunt dissection.  The appendix was then able to be visualized and grasped.  A combination of blunt dissection and harmonic scalpel were used to free it of its retroperitoneal attachments. Great care was taken to ensure no injury to surrounding retroperitoneal structures, cecum or terminal ileum. The mesoappendix was divided with the harmonic scalpel, isolating the base of the appendix. Hemostasis was ensured. A white load 45mm endoGIA stapler was used to transect the appendix from the cecum, taking a cuff of viable cecum with the specimen. The appendix was placed in an Endo Catch bag and removed through our 12 mm trocar site. The staple line was reinspected and confirmed to be intact, hemostatic, and viable. The small bowel was run several feet from the ilececal valve proximally with no other abnormalities identified. The 12mm trocar site in the left lower quadrant was closed with a 0 vicryl in the fascia under direct visualization using a PMI device. The abdomen was desufflated and all trocars removed. The skin incisions were closed with subcuticular 4-0 monocryl and Dermabond. The patient was awakened, extubated and transported to the recovery room in stable condition.    All counts were correct at the completion of the case.

## 2024-05-20 NOTE — Anesthesia Preprocedure Evaluation (Addendum)
 Anesthesia Evaluation  Patient identified by MRN, date of birth, ID band Patient awake    Reviewed: Allergy & Precautions, NPO status , Patient's Chart, lab work & pertinent test results  History of Anesthesia Complications Negative for: history of anesthetic complications  Airway Mallampati: II  TM Distance: >3 FB Neck ROM: Full    Dental no notable dental hx.    Pulmonary neg pulmonary ROS   Pulmonary exam normal        Cardiovascular negative cardio ROS Normal cardiovascular exam     Neuro/Psych   Anxiety Depression    negative neurological ROS     GI/Hepatic Neg liver ROS,GERD  Controlled,,Acute Appendicitis   Endo/Other    Class 4 obesity  Renal/GU negative Renal ROS     Musculoskeletal negative musculoskeletal ROS (+)    Abdominal   Peds  Hematology negative hematology ROS (+)   Anesthesia Other Findings Day of surgery medications reviewed with patient.  Reproductive/Obstetrics                              Anesthesia Physical Anesthesia Plan  ASA: 3  Anesthesia Plan: General   Post-op Pain Management: Ofirmev  IV (intra-op)* and Toradol  IV (intra-op)*   Induction: Intravenous  PONV Risk Score and Plan: 2 and Treatment may vary due to age or medical condition, Ondansetron , Dexamethasone , Midazolam  and Propofol  infusion  Airway Management Planned: Oral ETT  Additional Equipment: None  Intra-op Plan:   Post-operative Plan: Extubation in OR  Informed Consent: I have reviewed the patients History and Physical, chart, labs and discussed the procedure including the risks, benefits and alternatives for the proposed anesthesia with the patient or authorized representative who has indicated his/her understanding and acceptance.     Dental advisory given  Plan Discussed with: CRNA  Anesthesia Plan Comments:          Anesthesia Quick Evaluation

## 2024-05-20 NOTE — Anesthesia Procedure Notes (Signed)
 Procedure Name: Intubation Date/Time: 05/20/2024 5:09 PM  Performed by: Obadiah Reyes BROCKS, CRNAPre-anesthesia Checklist: Patient identified, Emergency Drugs available, Suction available and Patient being monitored Patient Re-evaluated:Patient Re-evaluated prior to induction Oxygen Delivery Method: Circle System Utilized Preoxygenation: Pre-oxygenation with 100% oxygen Induction Type: IV induction Ventilation: Mask ventilation without difficulty Grade View: Grade II Tube type: Oral Tube size: 7.5 mm Number of attempts: 1 Airway Equipment and Method: Stylet, Oral airway and Patient positioned with wedge pillow Placement Confirmation: ETT inserted through vocal cords under direct vision, positive ETCO2 and breath sounds checked- equal and bilateral Secured at: 23 cm Tube secured with: Tape Dental Injury: Teeth and Oropharynx as per pre-operative assessment

## 2024-05-20 NOTE — Transfer of Care (Signed)
 Immediate Anesthesia Transfer of Care Note  Patient: Austin FORBES Ku, MD  Procedure(s) Performed: APPENDECTOMY, LAPAROSCOPIC  Patient Location: PACU  Anesthesia Type:General  Level of Consciousness: awake, alert , and oriented  Airway & Oxygen Therapy: Patient Spontanous Breathing and Patient connected to face mask oxygen  Post-op Assessment: Report given to RN and Post -op Vital signs reviewed and stable  Post vital signs: Reviewed and stable  Last Vitals:  Vitals Value Taken Time  BP 149/98 05/20/24 18:04  Temp    Pulse 106 05/20/24 18:09  Resp 33 05/20/24 18:09  SpO2 96 % 05/20/24 18:09  Vitals shown include unfiled device data.  Last Pain:  Vitals:   05/20/24 1547  TempSrc:   PainSc: 4       Patients Stated Pain Goal: 5 (05/20/24 1504)  Complications: No notable events documented.

## 2024-05-20 NOTE — H&P (Signed)
 Austin FORBES Ku, MD 1986/02/26  [id number].    Requesting MD: Ruthe, MD Chief Complaint/Reason for Consult: Appendicitis   HPI:  Austin [name] is a 38 y/o M with PMH GERD and obesity who presents with abdominal pain. States he developed generalized, upper abdominal pain yesterday. Pain got progressively worse and woke him up from his sleep around 0400. The pain has low localized to the RLQ. Associated symptoms include vomiting and loose, non-bloody stools yesterday. Denies blood in his stool. Denies personal history of colonoscopy. Reports history of cholecystectomy in 2021. Denies tobacco use. Denies daily EtOH. Denies other drug use. 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 Reports allergies to PCN and sulfa - causes hives.   ROS: Review of Systems  All other systems reviewed and are negative.   History reviewed. No pertinent family history.  Past Medical History:  Diagnosis Date   Back pain    Gastric reflux     Past Surgical History:  Procedure Laterality Date   CHOLECYSTECTOMY  2006    Social History:  reports that he has never smoked. He has never been exposed to tobacco smoke. He has never used smokeless tobacco. He reports current alcohol use. No history on file for drug use.  Allergies:  Allergies  Allergen Reactions   Penicillins Hives and Dermatitis   Sulfonamide Derivatives Hives and Rash    Medications Prior to Admission  Medication Sig Dispense Refill   gabapentin  (NEURONTIN ) 600 MG tablet Take 1 tablet (600 mg total) by mouth 3 (three) times daily. 270 tablet 3   omeprazole (PRILOSEC) 20 MG capsule Take 20 mg by mouth daily.     oxyCODONE -acetaminophen  (PERCOCET) 10-325 MG tablet Take 1 tablet by mouth every 4 (four) hours as needed for pain. 30 tablet 0   rosuvastatin  (CRESTOR ) 10 MG tablet Take 1 tablet (10 mg total) by mouth daily. 90 tablet 3   traMADol  (ULTRAM ) 50 MG tablet Take 1 tablet (50 mg  total) by mouth every 8 (eight) hours as needed for moderate pain (pain score 4-6). 90 tablet 3   amphetamine -dextroamphetamine  (ADDERALL  XR) 15 MG 24 hr capsule Take 1 capsule by mouth daily. 90 capsule 0   gatifloxacin  (ZYMAXID ) 0.5 % SOLN Place 1 drop into the left eye 4 (four) times daily. 2.5 mL 0   predniSONE  (STERAPRED UNI-PAK 48 TAB) 10 MG (48) TBPK tablet Take by mouth daily. 12-day taper pack, use as directed for taper (Patient not taking: Reported on 05/20/2024) 1 tablet 0     Physical Exam: Blood pressure (!) 154/86, pulse 70, temperature 98.3 F (36.8 C), temperature source Oral, resp. rate 16, height 6' (1.829 m), weight (!) 172.4 kg, SpO2 96%. General: Pleasant white male  laying on hospital bed, appears stated age, NAD. HEENT: head -normocephalic, atraumatic; Eyes: no conjunctival injection, anicteric sclerae Neck- Trachea is midline CV- RRR, no lower extremity edema  Pulm- breathing is non-labored on room air Abd- soft, TTP RLQ and R flank without rebound tenderness or guarding,no hernias or masses GU- deferred  MSK- UE/LE symmetrical, no cyanosis, clubbing, or edema. Neuro- non-focal exam, moving all extremities, gait not assessed  Psych- Alert and Oriented x3 with appropriate affect Skin: warm and dry, no rashes or lesions   Results for orders placed or performed during the hospital encounter of 05/20/24 (from the past 48 hours)  CBC with Differential     Status: Abnormal   Collection Time: 05/20/24  9:39 AM  Result Value Ref Range   WBC 13.7 (H) 4.0 - 10.5 K/uL   RBC 5.16 4.22 - 5.81 MIL/uL   Hemoglobin 14.1 13.0 - 17.0 g/dL   HCT 58.8 60.9 - 47.9 %   MCV 79.7 (L) 80.0 - 100.0 fL   MCH 27.3 26.0 - 34.0 pg   MCHC 34.3 30.0 - 36.0 g/dL   RDW 86.7 88.4 - 84.4 %   Platelets 274 150 - 400 K/uL   nRBC 0.0 0.0 - 0.2 %   Neutrophils Relative % 86 %   Neutro Abs 11.6 (H) 1.7 - 7.7 K/uL   Lymphocytes Relative 9 %   Lymphs Abs 1.2 0.7 - 4.0 K/uL   Monocytes Relative 5  %   Monocytes Absolute 0.7 0.1 - 1.0 K/uL   Eosinophils Relative 0 %   Eosinophils Absolute 0.1 0.0 - 0.5 K/uL   Basophils Relative 0 %   Basophils Absolute 0.1 0.0 - 0.1 K/uL   Immature Granulocytes 0 %   Abs Immature Granulocytes 0.06 0.00 - 0.07 K/uL    Comment: Performed at [facility], [address] [place], [place] [address]  Comprehensive metabolic panel     Status: Abnormal   Collection Time: 05/20/24  9:39 AM  Result Value Ref Range   Sodium 139 135 - 145 mmol/L   Potassium 3.9 3.5 - 5.1 mmol/L   Chloride 103 98 - 111 mmol/L   CO2 25 22 - 32 mmol/L   Glucose, Bld 120 (H) 70 - 99 mg/dL    Comment: Glucose reference range applies only to samples taken after fasting for at least 8 hours.   BUN 21 (H) 6 - 20 mg/dL   Creatinine, Ser 9.13 0.61 - 1.24 mg/dL   Calcium  9.3 8.9 - 10.3 mg/dL   Total Protein 7.0 6.5 - 8.1 g/dL   Albumin 4.4 3.5 - 5.0 g/dL   AST 21 15 - 41 U/L   ALT 31 0 - 44 U/L   Alkaline Phosphatase 45 38 - 126 U/L   Total Bilirubin 0.4 0.0 - 1.2 mg/dL   GFR, Estimated >39 >39 mL/min    Comment: (NOTE) Calculated using the CKD-EPI Creatinine Equation (2021)    Anion gap 10 5 - 15    Comment: Performed at [facility], [address] [place], [place] [address]  Lipase, blood     Status: None   Collection Time: 05/20/24  9:39 AM  Result Value Ref Range   Lipase 19 11 - 51 U/L    Comment: Performed at [facility], [address] [place], [place] [address]  Troponin T, High Sensitivity     Status: None   Collection Time: 05/20/24  9:45 AM  Result Value Ref Range   Troponin T High Sensitivity <15 <19 ng/L    Comment: (NOTE) Biotin concentrations > 1000 ng/mL falsely decrease TnT results.  Serial cardiac troponin measurements are suggested.  Refer to the Links section for chest pain algorithms and additional  guidance. Performed at [facility], [address] [place], [place] [address]    CT ABDOMEN  PELVIS W CONTRAST Result Date: 05/20/2024 CLINICAL DATA:  38 year old male with acute abdominal pain. EXAM: CT ABDOMEN AND PELVIS WITH CONTRAST TECHNIQUE: Multidetector CT imaging of the abdomen and pelvis was performed using the standard protocol following bolus administration of intravenous contrast. RADIATION DOSE REDUCTION: This exam was performed according to the departmental  dose-optimization program which includes automated exposure control, adjustment of the mA and/or kV according to patient size and/or use of iterative reconstruction technique. CONTRAST:  OMNIPAQUE  IOHEXOL  300 MG/ML  SOLN COMPARISON:  CT Abdomen and Pelvis 923. FINDINGS: Lower chest: Negative; minor lung base atelectasis. Hepatobiliary: Chronic cholecystectomy.  Negative liver. Pancreas: Negative. Spleen: Negative. Adrenals/Urinary Tract: Normal adrenal glands. Symmetric and normal kidneys. Diminutive ureters and bladder. Incidental pelvic phleboliths. Stomach/Bowel: Occasional large bowel diverticula (proximal transverse colon). Largely decompressed large bowel in the abdomen and pelvis. Enlarged and inflamed retrocecal appendix. Appendix: Location: Retrocecal, coronal image 92 Diameter: 18 mm Appendicolith: Positive, coronal image 100 Mucosal hyper-enhancement: Positive Extraluminal gas: Negative Periappendiceal collection: No organized or drainable fluid collection. Peri appendiceal inflammation and trace layering fluid in the right gutter there. Decompressed terminal ileum. Negative small bowel. Stomach and duodenum also appear negative. No pneumoperitoneum. No other free fluid in the abdomen. Vascular/Lymphatic: Suboptimal intravascular contrast. Portal venous system appears to be patent. Normal caliber abdominal aorta. No atherosclerosis or lymphadenopathy identified. Reproductive: Negative. Other: No pelvis free fluid. Musculoskeletal: No acute osseous abnormality identified. IMPRESSION: 1. Acute Appendicitis, with evidence of  underlying appendicolith. Regional inflammation but no perforation or drainable fluid collection. 2. No other acute or inflammatory process identified in the abdomen or pelvis. Electronically Signed   By: VEAR Hurst M.D.   On: 05/20/2024 10:53      Assessment/Plan Acute appendicitis with appendicolith, without evidence of perforation 38 y/o M with <24h of abdominal pain associated with nausea and vomiting. WBC 13.7. CMP and lipase unremarkable. CT abdomen and pelvis shows acute appendicitis without perforation or abscess.    The operative and non-operative management of appendicitis was discussed with the patient. Risks of surgery including bleeding, infection, damage to surrounding structures, conversion to open, drain placement, prolonged hospital stay, as well as the risks of general anesthesia were discussed with the patient and he would like to proceed with surgery. Questions were welcomed and answered.    NPO, IVF Received cipro /flagyl  at 1100 Tylenol  on call to O.R.  I reviewed nursing notes, ED provider notes, last 24 h vitals and pain scores, last 48 h intake and output, last 24 h labs and trends, and last 24 h imaging results.  Almarie GORMAN Pringle, [facility] 05/20/2024, 3:18 PM Please see Amion for pager number during day hours 7:00am-4:30pm or 7:00am -11:30am on weekends

## 2024-05-20 NOTE — Discharge Instructions (Signed)

## 2024-05-21 ENCOUNTER — Other Ambulatory Visit (HOSPITAL_COMMUNITY): Payer: Self-pay

## 2024-05-21 ENCOUNTER — Other Ambulatory Visit (HOSPITAL_BASED_OUTPATIENT_CLINIC_OR_DEPARTMENT_OTHER): Payer: Self-pay

## 2024-05-21 ENCOUNTER — Encounter (HOSPITAL_COMMUNITY): Payer: Self-pay | Admitting: Surgery

## 2024-05-21 LAB — BASIC METABOLIC PANEL WITH GFR
Anion gap: 12 (ref 5–15)
BUN: 11 mg/dL (ref 6–20)
CO2: 24 mmol/L (ref 22–32)
Calcium: 9 mg/dL (ref 8.9–10.3)
Chloride: 102 mmol/L (ref 98–111)
Creatinine, Ser: 0.8 mg/dL (ref 0.61–1.24)
GFR, Estimated: >60 mL/min (ref >60)
Glucose, Bld: 116 mg/dL — ABNORMAL HIGH (ref 70–99)
Potassium: 3.9 mmol/L (ref 3.5–5.1)
Sodium: 138 mmol/L (ref 135–145)

## 2024-05-21 LAB — CBC
HCT: 42.5 % (ref 39.0–52.0)
Hemoglobin: 13.6 g/dL (ref 13.0–17.0)
MCH: 26.7 pg (ref 26.0–34.0)
MCHC: 32 g/dL (ref 30.0–36.0)
MCV: 83.5 fL (ref 80.0–100.0)
Platelets: 323 K/uL (ref 150–400)
RBC: 5.09 MIL/uL (ref 4.22–5.81)
RDW: 13.2 % (ref 11.5–15.5)
WBC: 15.4 K/uL — ABNORMAL HIGH (ref 4.0–10.5)
nRBC: 0 % (ref 0.0–0.2)

## 2024-05-21 MED ORDER — OXYCODONE HCL 5 MG PO TABS
5.0000 mg | ORAL_TABLET | Freq: Four times a day (QID) | ORAL | 0 refills | Status: AC | PRN
  Filled 2024-05-21 (×2): qty 15, 4d supply, fill #0

## 2024-05-21 MED ORDER — ACETAMINOPHEN 500 MG PO TABS
1000.0000 mg | ORAL_TABLET | Freq: Four times a day (QID) | ORAL | 0 refills | Status: AC
  Filled 2024-05-21: qty 30, 4d supply, fill #0

## 2024-05-21 MED ORDER — DOCUSATE SODIUM 100 MG PO CAPS
100.0000 mg | ORAL_CAPSULE | Freq: Two times a day (BID) | ORAL | 0 refills | Status: AC
  Filled 2024-05-21 (×2): qty 10, 5d supply, fill #0

## 2024-05-21 MED ORDER — METHOCARBAMOL 500 MG PO TABS
500.0000 mg | ORAL_TABLET | Freq: Three times a day (TID) | ORAL | 0 refills | Status: AC | PRN
  Filled 2024-05-21 (×2): qty 40, 14d supply, fill #0

## 2024-05-21 NOTE — Progress Notes (Signed)
 Progress Note  1 Day Post-Op  Subjective: Patient reports generalized soreness. Feels that pain has been manageable. Denies bowel movement. Reports flatulence. Tolerating regular diet without increased abdominal pain, nausea, or vomiting after intake.   Patient is a primary care provider.  ROS  All negative with the exception of above.  Objective: Vital signs in last 24 hours: Temp:  [97.4 F (36.3 C)-98.6 F (37 C)] 97.9 F (36.6 C) (07/31 0821) Pulse Rate:  [62-109] 77 (07/31 0821) Resp:  [9-22] 16 (07/31 0821) BP: (123-168)/(79-100) 123/80 (07/31 0821) SpO2:  [92 %-99 %] 97 % (07/31 0821) Weight:  [172.4 kg] 172.4 kg (07/30 1504) Last BM Date : 05/20/24  Intake/Output from previous day: 07/30 0701 - 07/31 0700 In: 4203.1 [P.O.:1480; I.V.:1268; IV Piggyback:1455.1] Out: 3550 [Urine:3550] Intake/Output this shift: No intake/output data recorded.  PE: General: Pleasant, male who is laying in bed in NAD. Heart: Regular, rate, and rhythm.  Lungs: Respiratory effort nonlabored Abd: Soft, ND. Mild generalized tenderness to palpation. No guarding or rebound tenderness. Incisions C/D/I Psych: A&Ox3 with an appropriate affect.    Lab Results:  Recent Labs    05/20/24 0939 05/21/24 0505  WBC 13.7* 15.4*  HGB 14.1 13.6  HCT 41.1 42.5  PLT 274 323   BMET Recent Labs    05/20/24 0939 05/21/24 0505  NA 139 138  K 3.9 3.9  CL 103 102  CO2 25 24  GLUCOSE 120* 116*  BUN 21* 11  CREATININE 0.86 0.80  CALCIUM  9.3 9.0   PT/INR No results for input(s): LABPROT, INR in the last 72 hours. CMP     Component Value Date/Time   NA 138 05/21/2024 0505   K 3.9 05/21/2024 0505   CL 102 05/21/2024 0505   CO2 24 05/21/2024 0505   GLUCOSE 116 (H) 05/21/2024 0505   BUN 11 05/21/2024 0505   CREATININE 0.80 05/21/2024 0505   CREATININE 1.53 (H) 03/21/2022 0000   CALCIUM  9.0 05/21/2024 0505   PROT 7.0 05/20/2024 0939   ALBUMIN 4.4 05/20/2024 0939   AST 21  05/20/2024 0939   ALT 31 05/20/2024 0939   ALKPHOS 45 05/20/2024 0939   BILITOT 0.4 05/20/2024 0939   GFRNONAA >60 05/21/2024 0505   GFRNONAA 102 07/24/2019 0930   GFRAA 118 07/24/2019 0930   Lipase     Component Value Date/Time   LIPASE 19 05/20/2024 0939       Studies/Results: CT ABDOMEN PELVIS W CONTRAST Result Date: 05/20/2024 CLINICAL DATA:  38 year old male with acute abdominal pain. EXAM: CT ABDOMEN AND PELVIS WITH CONTRAST TECHNIQUE: Multidetector CT imaging of the abdomen and pelvis was performed using the standard protocol following bolus administration of intravenous contrast. RADIATION DOSE REDUCTION: This exam was performed according to the departmental dose-optimization program which includes automated exposure control, adjustment of the mA and/or kV according to patient size and/or use of iterative reconstruction technique. CONTRAST:  OMNIPAQUE  IOHEXOL  300 MG/ML  SOLN COMPARISON:  CT Abdomen and Pelvis 923. FINDINGS: Lower chest: Negative; minor lung base atelectasis. Hepatobiliary: Chronic cholecystectomy.  Negative liver. Pancreas: Negative. Spleen: Negative. Adrenals/Urinary Tract: Normal adrenal glands. Symmetric and normal kidneys. Diminutive ureters and bladder. Incidental pelvic phleboliths. Stomach/Bowel: Occasional large bowel diverticula (proximal transverse colon). Largely decompressed large bowel in the abdomen and pelvis. Enlarged and inflamed retrocecal appendix. Appendix: Location: Retrocecal, coronal image 92 Diameter: 18 mm Appendicolith: Positive, coronal image 100 Mucosal hyper-enhancement: Positive Extraluminal gas: Negative Periappendiceal collection: No organized or drainable fluid collection. Peri appendiceal  inflammation and trace layering fluid in the right gutter there. Decompressed terminal ileum. Negative small bowel. Stomach and duodenum also appear negative. No pneumoperitoneum. No other free fluid in the abdomen. Vascular/Lymphatic: Suboptimal  intravascular contrast. Portal venous system appears to be patent. Normal caliber abdominal aorta. No atherosclerosis or lymphadenopathy identified. Reproductive: Negative. Other: No pelvis free fluid. Musculoskeletal: No acute osseous abnormality identified. IMPRESSION: 1. Acute Appendicitis, with evidence of underlying appendicolith. Regional inflammation but no perforation or drainable fluid collection. 2. No other acute or inflammatory process identified in the abdomen or pelvis. Electronically Signed   By: VEAR Hurst M.D.   On: 05/20/2024 10:53    Anti-infectives: Anti-infectives (From admission, onward)    Start     Dose/Rate Route Frequency Ordered Stop   05/21/24 0000  metroNIDAZOLE  (FLAGYL ) IVPB 500 mg       Placed in And Linked Group   500 mg 100 mL/hr over 60 Minutes Intravenous Every 12 hours 05/20/24 1835 05/25/24 2359   05/20/24 1930  cefTRIAXone  (ROCEPHIN ) 2 g in sodium chloride  0.9 % 100 mL IVPB       Placed in And Linked Group   2 g 200 mL/hr over 30 Minutes Intravenous Every 24 hours 05/20/24 1835 05/25/24 1929   05/20/24 1100  ciprofloxacin  (CIPRO ) IVPB 400 mg       Placed in And Linked Group   400 mg 200 mL/hr over 60 Minutes Intravenous  Once 05/20/24 1057 05/20/24 1205   05/20/24 1100  metroNIDAZOLE  (FLAGYL ) IVPB 500 mg       Placed in And Linked Group   500 mg 100 mL/hr over 60 Minutes Intravenous  Once 05/20/24 1057 05/20/24 1314        Assessment/Plan POD1: S/P Laparoscopic Appendectomy on 05/20/2024 by Dr. Mitzie Freund due to acute appendicitis. -Afebrile. Vitals stable -WBC 15.4 -Exam without significant findings -Tolerating regular diet. -Patient stable for discharge. Discussed post-operative risks and restrictions. Will provide return to work letter. Will arrange outpatient follow up in 3-4 weeks.   FEN: Regular diet, 0.9% sodium chloride  infusion at 100 mL/hr for 1 day on 05/20/2024 VTE: SCDs ID: Ceftriaxone  and metronidazole     LOS: 0 days    I reviewed nursing notes, last 24 h vitals and pain scores, last 48 h intake and output, last 24 h labs and trends, and last 24 h imaging results.   Marjorie Carlyon Favre, [facility] 05/21/2024, 8:57 AM Please see Amion for pager number during day hours 7:00am-4:30pm

## 2024-05-21 NOTE — Plan of Care (Signed)
  Problem: Activity: Goal: Risk for activity intolerance will decrease Outcome: Progressing   Problem: Nutrition: Goal: Adequate nutrition will be maintained Outcome: Completed/Met   Problem: Elimination: Goal: Will not experience complications related to urinary retention Outcome: Completed/Met

## 2024-05-21 NOTE — TOC Transition Note (Signed)
 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 No TOC consults/needs.           Patient Goals and CMS Choice            Discharge Placement                       Discharge Plan and Services Additional resources added to the After Visit Summary for                                       Social Drivers of Health (SDOH) Interventions SDOH Screenings   Food Insecurity: No Food Insecurity (05/20/2024)  Housing: Low Risk  (05/20/2024)  Transportation Needs: No Transportation Needs (05/20/2024)  Utilities: Not At Risk (05/20/2024)  Depression (PHQ2-9): Low Risk  (05/17/2021)  Tobacco Use: Low Risk  (05/20/2024)     Readmission Risk Interventions     No data to display

## 2024-05-22 LAB — SURGICAL PATHOLOGY

## 2024-05-22 NOTE — Anesthesia Postprocedure Evaluation (Signed)
 Anesthesia Post Note  Patient: Austin FORBES Ku, MD  Procedure(s) Performed: APPENDECTOMY, LAPAROSCOPIC     Patient location during evaluation: PACU Anesthesia Type: General Level of consciousness: awake and alert Pain management: pain level controlled Vital Signs Assessment: post-procedure vital signs reviewed and stable Respiratory status: spontaneous breathing, nonlabored ventilation and respiratory function stable Cardiovascular status: blood pressure returned to baseline Postop Assessment: no apparent nausea or vomiting Anesthetic complications: no   No notable events documented.       Vertell Row

## 2024-06-08 NOTE — Discharge Summary (Signed)
 [facility] Discharge Summary   Patient ID: MUNEER LEIDER, MD MRN: [id number] DOB/AGE: Aug 13, 1986 38 y.o.  Admit date: 05/20/2024 Discharge date: 05/21/24  Admitting Diagnosis: Appendicitis   Discharge Diagnosis Patient Active Problem List   Diagnosis Date Noted   S/P appendectomy 05/20/2024   Mixed hyperlipidemia 02/03/2024   Corneal abrasion 06/17/2023   Myalgia 12/07/2022   Right hip pain 07/03/2022   Nausea vomiting and diarrhea 03/21/2022   Exposure to influenza 02/12/2022   Adult ADHD 01/10/2022   Degeneration of thoracolumbar intervertebral disc 01/20/2021   Annual physical exam 09/02/2020   Anxiety and depression 08/26/2020   Abnormal weight gain 07/28/2020    Consultants None  Imaging: No results found.  Procedures  Dr. Signe (05/20/24) - Laparoscopic Appendectomy  Hospital Course:  38 y/o M who presented to [facility] with abdominal pain.  Workup showed appendicitis .  Patient was admitted and underwent procedure listed above.  Tolerated procedure well and was transferred to the floor.  Diet was advanced as tolerated.  On POD#1, the patient was voiding well, tolerating diet, ambulating well, pain well controlled, vital signs stable, incisions c/d/i and felt stable for discharge home.  Patient will follow up in our office as below and knows to call with questions or concerns.   I have personally reviewed the patients medication history on the [place] controlled substance database.    Physical Exam: General:  Alert, NAD, pleasant, comfortable Abd:  Soft, ND, mild tenderness, incisions C/D/I  Allergies as of 05/21/2024       Reactions   Penicillins Hives, Dermatitis   Sulfonamide Derivatives Hives, Rash        Medication List     STOP taking these medications    oxyCODONE -acetaminophen  10-325 MG tablet Commonly known as: Percocet       TAKE these medications    acetaminophen  500 MG tablet Commonly known as: TYLENOL  Take 2 tablets (1,000  mg total) by mouth every 6 (six) hours.   amphetamine -dextroamphetamine  15 MG 24 hr capsule Commonly known as: Adderall  XR Take 1 capsule by mouth daily.   gabapentin  600 MG tablet Commonly known as: NEURONTIN  Take 1 tablet (600 mg total) by mouth 3 (three) times daily.   methocarbamol  500 MG tablet Commonly known as: ROBAXIN  Take 1 tablet (500 mg total) by mouth every 8 (eight) hours as needed for muscle spasms.   omeprazole 20 MG capsule Commonly known as: PRILOSEC Take 20 mg by mouth daily.   oxyCODONE  5 MG immediate release tablet Commonly known as: Oxy IR/ROXICODONE  Take 1 tablet (5 mg total) by mouth every 6 (six) hours as needed for severe pain (pain score 7-10).   rosuvastatin  10 MG tablet Commonly known as: Crestor  Take 1 tablet (10 mg total) by mouth daily.   Stool Softener 100 MG capsule Generic drug: docusate sodium  Take 1 capsule (100 mg total) by mouth 2 (two) times daily.   traMADol  50 MG tablet Commonly known as: ULTRAM  Take 1 tablet (50 mg total) by mouth every 8 (eight) hours as needed for moderate pain (pain score 4-6).          Follow-up Information     Maczis, Puja Gosai, PA-C. Go on 06/18/2024.   Specialty: General Surgery Why: at 2:00 PM for post-operative follow up. please arrive 20-30 minutes early. Contact information: [address] [place] [address] [phone]                 Signed: Almarie Pringle, [facility]  [facility] 06/08/2024, 9:22 AM

## 2024-06-23 ENCOUNTER — Encounter: Payer: Self-pay | Admitting: Sports Medicine
